# Patient Record
Sex: Female | Born: 2000 | Race: White | Hispanic: Yes | Marital: Single | Smoking: Never smoker
Health system: Southern US, Community
[De-identification: ages and names within clinical notes are randomized; demographics above are authoritative.]

---
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
## 2019-05-06 ENCOUNTER — Emergency Department (HOSPITAL_COMMUNITY): Payer: Medicaid Other

## 2019-05-06 ENCOUNTER — Encounter (HOSPITAL_COMMUNITY): Payer: Self-pay | Admitting: Emergency Medicine

## 2019-05-06 ENCOUNTER — Emergency Department (HOSPITAL_COMMUNITY)
Admission: EM | Admit: 2019-05-06 | Discharge: 2019-05-07 | Disposition: A | Discharge status: Home / Self Care | Payer: Medicaid Other | Attending: Emergency Medicine | Admitting: Emergency Medicine

## 2019-05-06 ENCOUNTER — Other Ambulatory Visit: Payer: Self-pay

## 2019-05-06 DIAGNOSIS — R0789 Other chest pain: Secondary | ICD-10-CM | POA: Insufficient documentation

## 2019-05-06 DIAGNOSIS — R070 Pain in throat: Secondary | ICD-10-CM | POA: Diagnosis not present

## 2019-05-06 DIAGNOSIS — R110 Nausea: Secondary | ICD-10-CM | POA: Insufficient documentation

## 2019-05-06 LAB — CBC
HCT: 38.8 % (ref 36.0–46.0)
Hemoglobin: 13.2 g/dL (ref 12.0–15.0)
MCH: 31 pg (ref 26.0–34.0)
MCHC: 34 g/dL (ref 30.0–36.0)
MCV: 91.1 fL (ref 80.0–100.0)
Platelets: 370 10*3/uL (ref 150–400)
RBC: 4.26 MIL/uL (ref 3.87–5.11)
RDW: 12 % (ref 11.5–15.5)
WBC: 7.3 10*3/uL (ref 4.0–10.5)
nRBC: 0 % (ref 0.0–0.2)

## 2019-05-06 LAB — PROTIME-INR
INR: 0.9 (ref 0.8–1.2)
Prothrombin Time: 12.5 seconds (ref 11.4–15.2)

## 2019-05-06 MED ORDER — SODIUM CHLORIDE 0.9% FLUSH: 3.0000 mL | Freq: Once | INTRAVENOUS | Status: DC

## 2019-05-06 NOTE — ED Triage Notes (Addendum)
Patient arrived with EMS from work reports central chest pressure/burning this evening with mild nausea and occasional dry cough , denies emesis or diaphoresis . No fever or chills . She received ASA 324 mg by EMS , CBG= 135.

## 2019-05-07 LAB — BASIC METABOLIC PANEL
Anion gap: 12 (ref 5–15)
BUN: 11 mg/dL (ref 6–20)
CO2: 21 mmol/L — ABNORMAL LOW (ref 22–32)
Calcium: 8.9 mg/dL (ref 8.9–10.3)
Chloride: 104 mmol/L (ref 98–111)
Creatinine, Ser: 0.84 mg/dL (ref 0.44–1.00)
GFR calc Af Amer: >60 mL/min (ref >60)
GFR calc non Af Amer: >60 mL/min (ref >60)
Glucose, Bld: 94 mg/dL (ref 70–99)
Potassium: 3.7 mmol/L (ref 3.5–5.1)
Sodium: 137 mmol/L (ref 135–145)

## 2019-05-07 LAB — TROPONIN I (HIGH SENSITIVITY): Troponin I (High Sensitivity): <2 ng/L (ref <18)

## 2019-05-07 MED ORDER — FAMOTIDINE 20 MG PO TABS: 20.0000 mg | ORAL_TABLET | Freq: Two times a day (BID) | ORAL | 0 refills | Status: DC

## 2019-05-07 MED ORDER — SUCRALFATE 1 G PO TABS: 1.0000 g | ORAL_TABLET | Freq: Three times a day (TID) | ORAL | 0 refills | Status: DC

## 2019-05-07 NOTE — ED Provider Notes (Signed)
[facility] EMERGENCY DEPARTMENT Provider Note   CSN: [id number] Arrival date & time: 05/06/19  2248     History Chief Complaint  Patient presents with  . Chest Pain    Sue Wiggins is a 19 y.o. female with a hx of major medical problems presents to the Emergency Department complaining of gradual, constant, now resolved nausea onset around 6 PM.  Patient reports that she had associated burning in her central chest and the back of her throat starting around 10 PM.  She reports this made her very nervous therefore she called 911.  She reports she was given aspirin by them.  Patient reports all of her symptoms have resolved completely.  She denies fever, chills, vomiting, abdominal pain, diarrhea, weakness, dizziness, syncope, dysuria.  She denies previous cardiac history.  She had no associated diaphoresis, syncope, near syncope or shortness of breath.  She has had no known Covid contacts and no additional Covid symptoms.  No loss of taste or smell.  Patient reports she did eat some spicy food prior to the onset of symptoms.   The history is provided by the patient and medical records. No language interpreter was used.       History reviewed. No pertinent past medical history.  There are no problems to display for this patient.   History reviewed. No pertinent surgical history.   OB History   No obstetric history on file.     No family history on file.  Social History   Tobacco Use  . Smoking status: Never Smoker  . Smokeless tobacco: Never Used  Substance Use Topics  . Alcohol use: Never  . Drug use: Never    Home Medications Prior to Admission medications   Medication Sig Start Date End Date Taking? Authorizing Provider  famotidine (PEPCID) 20 MG tablet Take 1 tablet (20 mg total) by mouth 2 (two) times daily. 05/07/19   Tinesha Siegrist, Dahlia Client, PA-C  sucralfate (CARAFATE) 1 g tablet Take 1 tablet (1 g total) by mouth 4 (four) times daily -  with  meals and at bedtime. 05/07/19   Labresha Mellor, Dahlia Client, PA-C    Allergies    Patient has no known allergies.  Review of Systems   Review of Systems  Constitutional: Negative for appetite change, diaphoresis, fatigue, fever and unexpected weight change.  HENT: Positive for sore throat. Negative for mouth sores.   Eyes: Negative for visual disturbance.  Respiratory: Negative for cough, chest tightness, shortness of breath and wheezing.   Cardiovascular: Positive for chest pain.  Gastrointestinal: Positive for nausea. Negative for abdominal pain, constipation, diarrhea and vomiting.  Endocrine: Negative for polydipsia, polyphagia and polyuria.  Genitourinary: Negative for dysuria, frequency, hematuria and urgency.  Musculoskeletal: Negative for back pain and neck stiffness.  Skin: Negative for rash.  Allergic/Immunologic: Negative for immunocompromised state.  Neurological: Negative for syncope, light-headedness and headaches.  Hematological: Does not bruise/bleed easily.  Psychiatric/Behavioral: Negative for sleep disturbance. The patient is not nervous/anxious.     Physical Exam Updated Vital Signs BP (!) 139/114 (BP Location: Left Arm)   Pulse 97   Temp 98.4 F (36.9 C) (Oral)   Resp 18   LMP 05/05/2019   SpO2 100%   Physical Exam Vitals and nursing note reviewed.  Constitutional:      General: She is not in acute distress.    Appearance: She is not diaphoretic.  HENT:     Head: Normocephalic.  Eyes:     General: No scleral icterus.  Conjunctiva/sclera: Conjunctivae normal.  Cardiovascular:     Rate and Rhythm: Normal rate and regular rhythm.     Pulses: Normal pulses.          Radial pulses are 2+ on the right side and 2+ on the left side.  Pulmonary:     Effort: No tachypnea, accessory muscle usage, prolonged expiration, respiratory distress or retractions.     Breath sounds: Normal breath sounds. No stridor.     Comments: Equal chest rise. No increased work of  breathing. Abdominal:     General: There is no distension.     Palpations: Abdomen is soft.     Tenderness: There is no abdominal tenderness. There is no guarding or rebound.  Musculoskeletal:     Cervical back: Normal range of motion.     Comments: Moves all extremities equally and without difficulty.  Skin:    General: Skin is warm and dry.     Capillary Refill: Capillary refill takes less than 2 seconds.  Neurological:     Mental Status: She is alert.     GCS: GCS eye subscore is 4. GCS verbal subscore is 5. GCS motor subscore is 6.     Comments: Speech is clear and goal oriented.  Psychiatric:        Mood and Affect: Mood normal.     ED Results / Procedures / Treatments   Labs (all labs ordered are listed, but only abnormal results are displayed) Labs Reviewed  BASIC METABOLIC PANEL - Abnormal; Notable for the following components:      Result Value   CO2 21 (*)    All other components within normal limits  CBC  PROTIME-INR  I-STAT BETA HCG BLOOD, ED ([facility], [facility], [facility] ONLY)  TROPONIN I (HIGH SENSITIVITY)  TROPONIN I (HIGH SENSITIVITY)    EKG EKG Interpretation  Date/Time:  Saturday May 06 2019 22:49:53 EST Ventricular Rate:  81 PR Interval:  136 QRS Duration: 82 QT Interval:  382 QTC Calculation: 443 R Axis:   68 Text Interpretation: Sinus rhythm with marked sinus arrhythmia Confirmed by Palumbo, April ([id number]) on 05/06/2019 11:17:43 PM   Radiology DG Chest 2 View  Result Date: 05/06/2019 CLINICAL DATA:  Chest pain. Dizziness and lightheaded. EXAM: CHEST - 2 VIEW COMPARISON:  None. FINDINGS: The cardiomediastinal contours are normal. The lungs are clear. Pulmonary vasculature is normal. No consolidation, pleural effusion, or pneumothorax. No acute osseous abnormalities are seen. IMPRESSION: Negative radiographs of the chest. Electronically Signed   By: Narda Rutherford M.D.   On: 05/06/2019 23:22    Procedures Procedures (including critical care  time)  Medications Ordered in ED Medications  sodium chloride flush (NS) 0.9 % injection 3 mL (has no administration in time range)    ED Course  I have reviewed the triage vital signs and the nursing notes.  Pertinent labs & imaging results that were available during my care of the patient were reviewed by me and considered in my medical decision making (see chart for details).  Clinical Course as of May 07 323  Sun May 07, 2019  0054 Negative.  Did not crossover  I-Stat beta hCG blood, ED [HM]  0056 No evidence of pulmonary edema, consolidation or groundglass opacities.  I personally evaluated these images.  DG Chest 2 View [HM]    Clinical Course User Index [HM] Ameenah Prosser, Boyd Kerbs   MDM Rules/Calculators/A&P  Patient presents with burning chest pain with associated nausea.  Chest pain is not likely of cardiac or pulmonary etiology d/t presentation, PERC negative, VSS, no tracheal deviation, no JVD or new murmur, RRR, breath sounds equal bilaterally, EKG without acute abnormalities, negative troponin, and negative CXR.  Suspect symptoms are secondary to acid reflux given the associated burning in the back of her throat.  She is well-appearing.  Offered Covid testing and patient has declined.  Pt has been advised to return to the ED if CP becomes exertional, associated with diaphoresis or nausea, radiates to left jaw/arm, worsens or becomes concerning in any way. Pt appears reliable for follow up and is agreeable to discharge.   BP (!) 132/98 (BP Location: Right Arm)   Pulse 86   Temp 98.4 F (36.9 C) (Oral)   Resp 16   LMP 05/05/2019   SpO2 100%    Final Clinical Impression(s) / ED Diagnoses Final diagnoses:  Burning chest pain    Rx / DC Orders ED Discharge Orders         Ordered    famotidine (PEPCID) 20 MG tablet  2 times daily     05/07/19 0051    sucralfate (CARAFATE) 1 g tablet  3 times daily with meals & bedtime     05/07/19 0051            Flavius Repsher, Jarrett Soho, PA-C 05/07/19 [place], April, MD 05/07/19 [phone]

## 2019-05-07 NOTE — Discharge Instructions (Addendum)
1. Medications: Carafate, Pepcid, usual home medications 2. Treatment: rest, drink plenty of fluids, advance diet slowly 3. Follow Up: Please followup with your primary doctor in 2 days for discussion of your diagnoses and further evaluation after today's visit; Please return to the ER for return of pain, persistent vomiting, high fevers or worsening symptoms

## 2019-05-07 NOTE — ED Notes (Signed)
Pt discharge and prescription education provided. Pt verbalizes understanding. Pt is alert and oriented x 4 and ambulatory at discharge.  

## 2020-05-13 ENCOUNTER — Other Ambulatory Visit: Payer: Medicaid Other

## 2021-04-08 ENCOUNTER — Other Ambulatory Visit: Payer: Self-pay

## 2021-04-08 ENCOUNTER — Encounter: Payer: Self-pay | Admitting: Nurse Practitioner

## 2021-04-08 ENCOUNTER — Other Ambulatory Visit (HOSPITAL_COMMUNITY)
Admission: RE | Admit: 2021-04-08 | Discharge: 2021-04-08 | Disposition: A | Discharge status: Home / Self Care | Payer: Medicaid Other | Source: Ambulatory Visit | Attending: Nurse Practitioner | Admitting: Nurse Practitioner

## 2021-04-08 ENCOUNTER — Ambulatory Visit (INDEPENDENT_AMBULATORY_CARE_PROVIDER_SITE_OTHER): Payer: Medicaid Other | Admitting: Nurse Practitioner

## 2021-04-08 VITALS — BP 117/73 | HR 75 | Ht 61.0 in | Wt 139.0 lb

## 2021-04-08 DIAGNOSIS — Z6826 Body mass index (BMI) 26.0-26.9, adult: Secondary | ICD-10-CM

## 2021-04-08 DIAGNOSIS — Z113 Encounter for screening for infections with a predominantly sexual mode of transmission: Secondary | ICD-10-CM | POA: Diagnosis not present

## 2021-04-08 DIAGNOSIS — Z3041 Encounter for surveillance of contraceptive pills: Secondary | ICD-10-CM | POA: Diagnosis not present

## 2021-04-08 DIAGNOSIS — Z01419 Encounter for gynecological examination (general) (routine) without abnormal findings: Secondary | ICD-10-CM | POA: Insufficient documentation

## 2021-04-08 DIAGNOSIS — A749 Chlamydial infection, unspecified: Secondary | ICD-10-CM

## 2021-04-08 DIAGNOSIS — Z7289 Other problems related to lifestyle: Secondary | ICD-10-CM | POA: Insufficient documentation

## 2021-04-08 MED ORDER — BLISOVI 24 FE 1-20 MG-MCG(24) PO TABS: 1.0000 | ORAL_TABLET | Freq: Every day | ORAL | 11 refills | Status: DC

## 2021-04-08 NOTE — Progress Notes (Signed)
GYNECOLOGY ANNUAL PREVENTATIVE CARE ENCOUNTER NOTE  Subjective:   Sue Wiggins is a 21 y.o. G0P0000 female here for a routine annual gynecologic exam.  Current complaints: new patient here and wants to have pills prescribed from this office.  [facility] but is not in school at present.   Denies abnormal vaginal bleeding, discharge, pelvic pain, problems with intercourse or other gynecologic concerns.    Gynecologic History Patient's last menstrual period was 03/28/2021 (exact date). Contraception: OCP (estrogen/progesterone) Last Pap: April 2022 outside of [place]. Results were: normal per patient.   Obstetric History OB History  Gravida Para Term Preterm AB Living  0 0 0 0 0 0  SAB IAB Ectopic Multiple Live Births  0 0 0 0 0    No past medical history on file.  No past surgical history on file.  Current Outpatient Medications on File Prior to Visit  Medication Sig Dispense Refill   famotidine (PEPCID) 20 MG tablet Take 1 tablet (20 mg total) by mouth 2 (two) times daily. 10 tablet 0   sucralfate (CARAFATE) 1 g tablet Take 1 tablet (1 g total) by mouth 4 (four) times daily -  with meals and at bedtime. 30 tablet 0   No current facility-administered medications on file prior to visit.    No Known Allergies  Social History   Socioeconomic History   Marital status: Single    Spouse name: Not on file   Number of children: Not on file   Years of education: Not on file   Highest education level: Not on file  Occupational History   Not on file  Tobacco Use   Smoking status: Never   Smokeless tobacco: Never  Vaping Use   Vaping Use: Every day  Substance and Sexual Activity   Alcohol use: Yes   Drug use: Yes    Types: Marijuana   Sexual activity: Yes    Birth control/protection: Pill  Other Topics Concern   Not on file  Social History Narrative   Not on file   Social Determinants of Health   Financial Resource Strain:  Not on file  Food Insecurity: Not on file  Transportation Needs: Not on file  Physical Activity: Not on file  Stress: Not on file  Social Connections: Not on file  Intimate Partner Violence: Not on file    No family history on file.  The following portions of the patient's history were reviewed and updated as appropriate: allergies, current medications, past family history, past medical history, past social history, past surgical history and problem list.  Review of Systems Pertinent items noted in HPI and remainder of comprehensive ROS otherwise negative.   Objective:  BP 117/73    Pulse 75    Ht 5\' 1"  (1.549 m)    Wt 139 lb (63 kg)    LMP 03/28/2021 (Exact Date)    BMI 26.26 kg/m  CONSTITUTIONAL: Well-developed, well-nourished female in no acute distress.  HENT:  Normocephalic, atraumatic, External right and left ear normal.  EYES: Conjunctivae and EOM are normal. Pupils are equal, round.  No scleral icterus.  NECK: Normal range of motion, supple, no masses.  Normal thyroid.  SKIN: Skin is warm and dry. No rash noted. Not diaphoretic. No erythema. No pallor.  Multiple tatoos. NEUROLOGIC: Alert and oriented to person, place, and time. Normal reflexes, muscle tone coordination. No cranial nerve deficit noted. PSYCHIATRIC: Normal mood and affect. Normal behavior. Normal judgment and thought content. CARDIOVASCULAR:  Normal heart rate noted, regular rhythm RESPIRATORY: Clear to auscultation bilaterally. Effort and breath sounds normal, no problems with respiration noted. BREASTS: Symmetric in size. Bilateral nipple piercings.  No masses, skin changes, nipple drainage, or lymphadenopathy. ABDOMEN: Soft, no distention noted.  No tenderness, rebound or guarding.  PELVIC: Normal appearing external genitalia; normal appearing vaginal mucosa and cervix.  No abnormal discharge noted.  Pap smear obtained.  Normal uterine size, no other palpable masses, no uterine or adnexal  tenderness. MUSCULOSKELETAL: Normal range of motion. No tenderness.  No cyanosis, clubbing, or edema.    Assessment and Plan:  1. Well woman exam with routine gynecological exam Bp normal.  Has been on pills for 2 months.  Will prescribe for one year as she is pleased with decreased cramping she has on this pill. Wants STD testing  - Cervicovaginal ancillary only - Hepatitis B surface antigen - Hepatitis C antibody - HIV Antibody (routine testing w rflx) - RPR  2. BMI 26.0-26.9,adult   3. Engages in vaping Strongly encouraged to stop vaping - started recently Discussed possible lung damage from unregulated vaping substances Encouraged using slow deep breathing for relaxation and stress management.  Routine preventative health maintenance measures emphasized. Please refer to After Visit Summary for other counseling recommendations.    Nolene Bernheim, RN, MSN, NP-BC Nurse Practitioner, [facility] for [facility]

## 2021-04-09 LAB — RPR: RPR Ser Ql: NONREACTIVE

## 2021-04-09 LAB — HEPATITIS B SURFACE ANTIGEN: Hepatitis B Surface Ag: NEGATIVE

## 2021-04-09 LAB — CERVICOVAGINAL ANCILLARY ONLY
Chlamydia: POSITIVE — AB
Comment: NEGATIVE
Comment: NEGATIVE
Comment: NORMAL
Neisseria Gonorrhea: NEGATIVE
Trichomonas: NEGATIVE

## 2021-04-09 LAB — HEPATITIS C ANTIBODY: Hep C Virus Ab: 0.2 s/co ratio (ref 0.0–0.9)

## 2021-04-09 LAB — HIV ANTIBODY (ROUTINE TESTING W REFLEX): HIV Screen 4th Generation wRfx: NONREACTIVE

## 2021-04-10 DIAGNOSIS — A749 Chlamydial infection, unspecified: Secondary | ICD-10-CM | POA: Insufficient documentation

## 2021-04-10 MED ORDER — DOXYCYCLINE HYCLATE 50 MG PO CAPS: 100.0000 mg | ORAL_CAPSULE | Freq: Two times a day (BID) | ORAL | 0 refills | Status: AC

## 2021-04-10 NOTE — Addendum Note (Signed)
Addended by: Currie Paris on: 04/10/2021 10:04 AM   Modules accepted: Orders

## 2021-04-24 IMAGING — CR DG CHEST 2V
2 series · 2 of 2 positions shown · non-contrast
Comparison: None.

CLINICAL DATA: Chest pain. Dizziness and lightheaded.

EXAM:
CHEST - 2 VIEW

[chest pa]
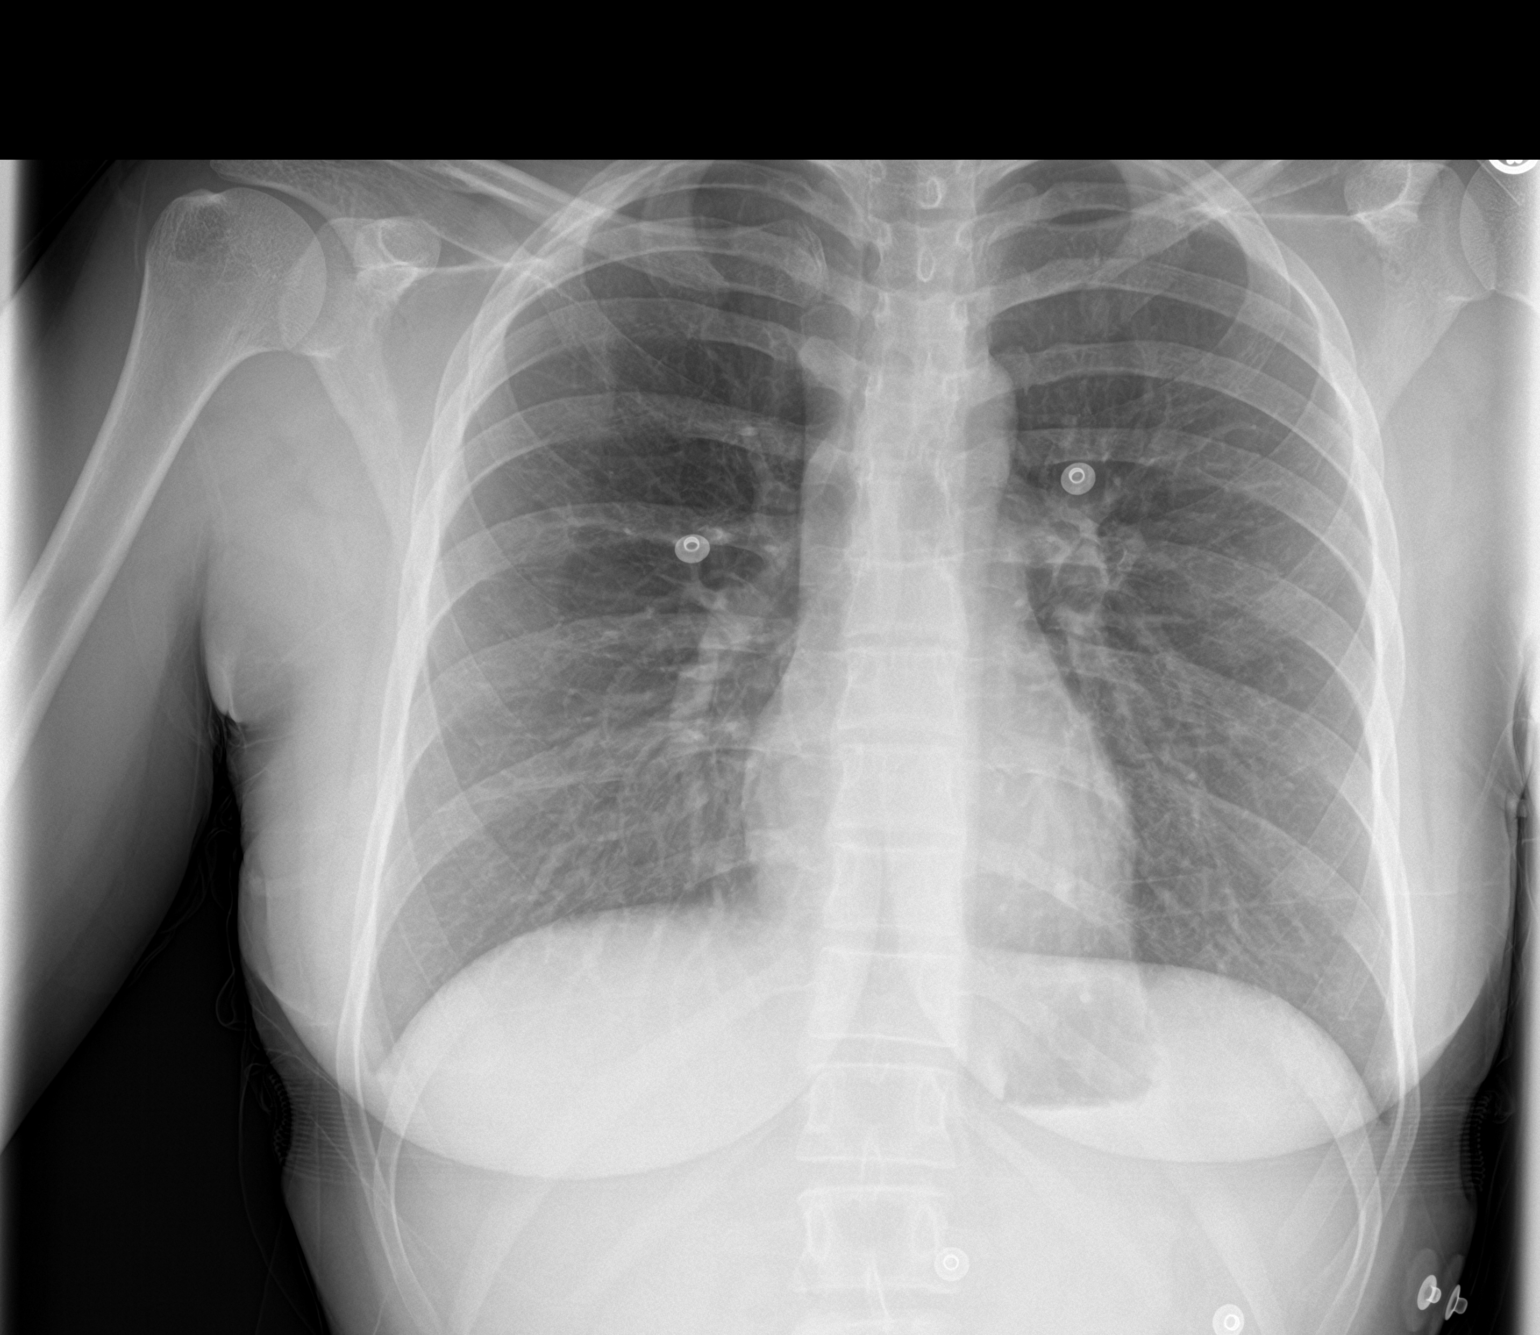

[chest lat]
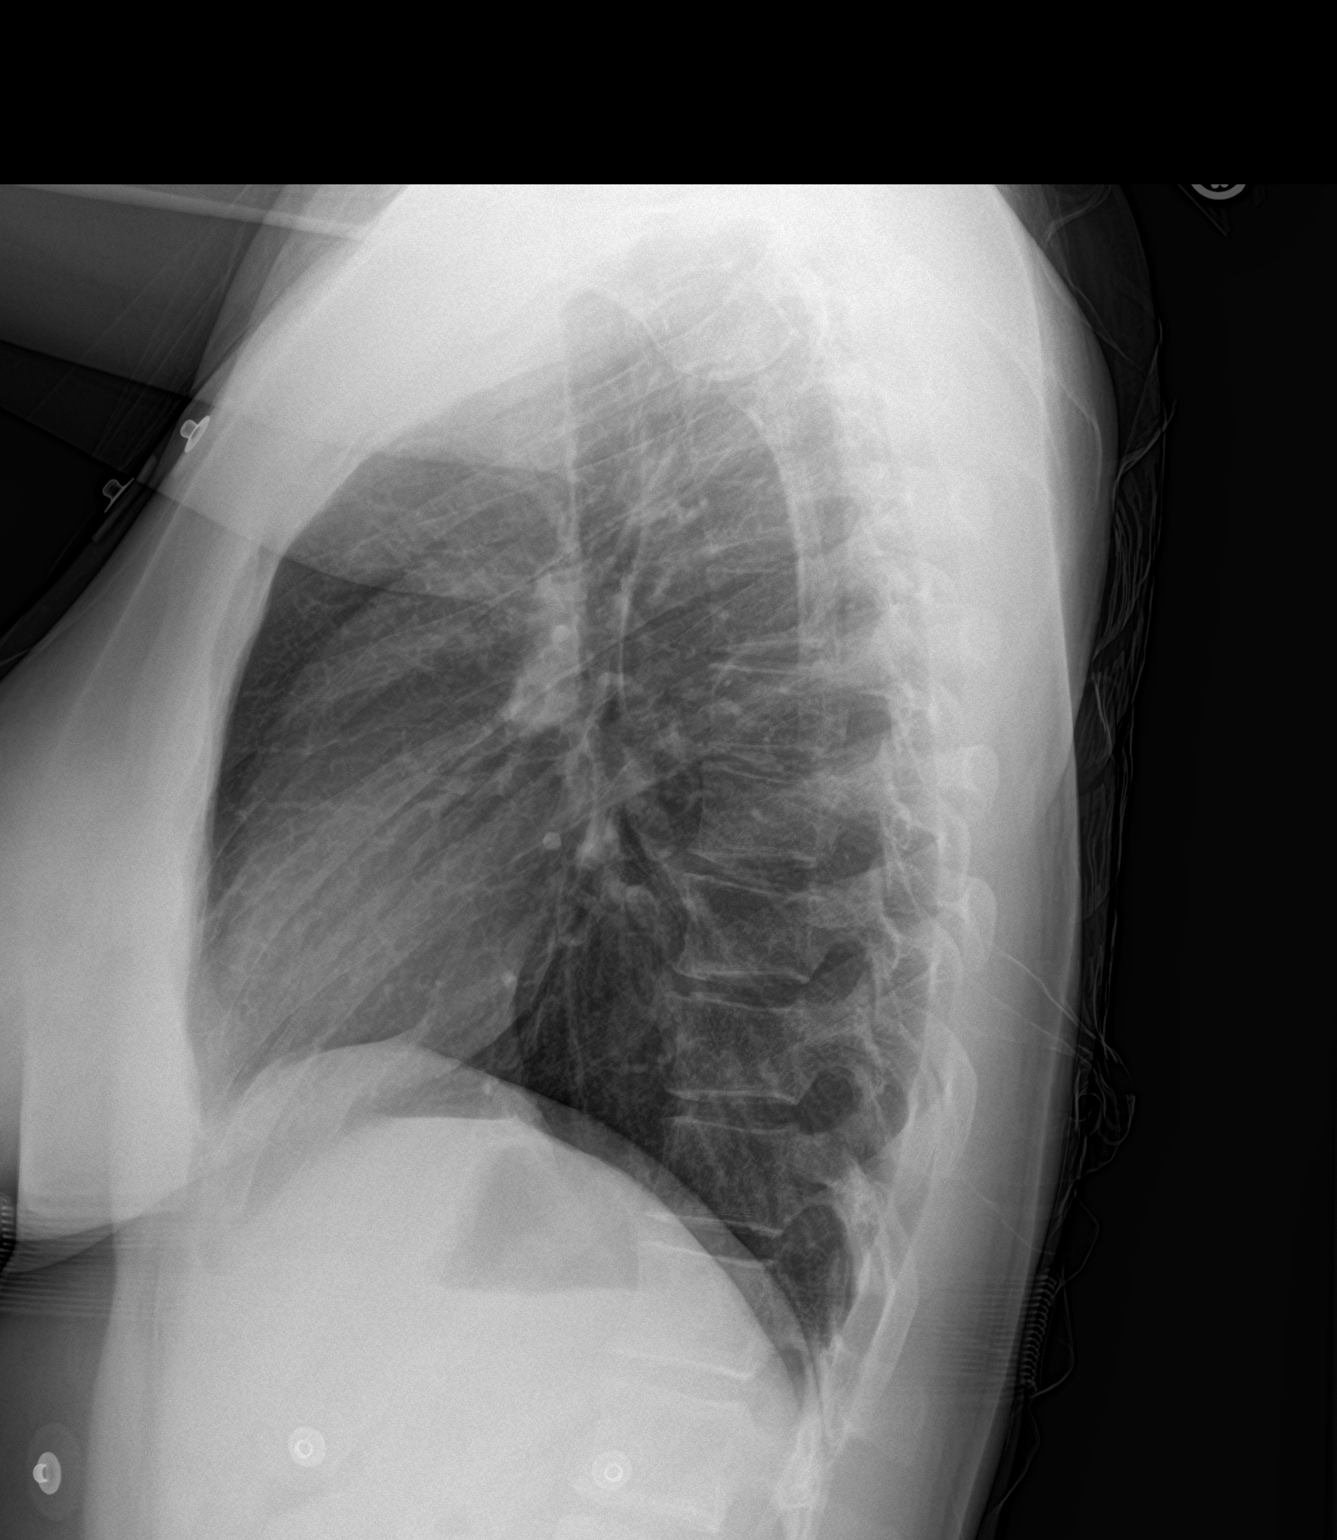

[2 of 2 positions shown; findings below may reference images not displayed]

FINDINGS: The cardiomediastinal contours are normal. The lungs are clear.
Pulmonary vasculature is normal. No consolidation, pleural effusion,
or pneumothorax. No acute osseous abnormalities are seen.
IMPRESSION: Negative radiographs of the chest.

## 2021-05-01 ENCOUNTER — Encounter (HOSPITAL_COMMUNITY): Payer: Self-pay

## 2021-05-01 ENCOUNTER — Ambulatory Visit (HOSPITAL_COMMUNITY)
Admission: EM | Admit: 2021-05-01 | Discharge: 2021-05-01 | Disposition: A | Discharge status: Home / Self Care | Payer: Medicaid Other | Attending: Internal Medicine | Admitting: Internal Medicine

## 2021-05-01 DIAGNOSIS — R059 Cough, unspecified: Secondary | ICD-10-CM | POA: Insufficient documentation

## 2021-05-01 DIAGNOSIS — R519 Headache, unspecified: Secondary | ICD-10-CM | POA: Diagnosis not present

## 2021-05-01 DIAGNOSIS — R0981 Nasal congestion: Secondary | ICD-10-CM | POA: Diagnosis not present

## 2021-05-01 DIAGNOSIS — R6889 Other general symptoms and signs: Secondary | ICD-10-CM

## 2021-05-01 DIAGNOSIS — R1031 Right lower quadrant pain: Secondary | ICD-10-CM | POA: Diagnosis not present

## 2021-05-01 DIAGNOSIS — R1032 Left lower quadrant pain: Secondary | ICD-10-CM | POA: Diagnosis not present

## 2021-05-01 DIAGNOSIS — Z20822 Contact with and (suspected) exposure to covid-19: Secondary | ICD-10-CM | POA: Diagnosis not present

## 2021-05-01 LAB — POC INFLUENZA A AND B ANTIGEN (URGENT CARE ONLY)
INFLUENZA A ANTIGEN, POC: NEGATIVE
INFLUENZA B ANTIGEN, POC: NEGATIVE

## 2021-05-01 MED ORDER — IBUPROFEN 600 MG PO TABS: 600.0000 mg | ORAL_TABLET | Freq: Four times a day (QID) | ORAL | 0 refills | Status: DC | PRN

## 2021-05-01 NOTE — ED Provider Notes (Signed)
[facility]    CSN: [id number] Arrival date & time: 05/01/21  1913      History   Chief Complaint Chief Complaint  Patient presents with   Cough   Nasal Congestion   Headache    RIGHT SIDE GROIN PAIN X 1-2 DAYS.    HPI Sue Wiggins is a 21 y.o. female comes to the urgent care with a 1 day history of sore throat, headache, generalized body aches and a cough.  Cough is nonproductive.  Patient denies any shortness of breath or wheezing.  No nausea, vomiting or diarrhea.  Patient complains of left groin pain which started yesterday as well.  She went to the gym and used the [employer].  This is a routine over the past couple of weeks.  She denies any trauma to the groin.  No swelling, bruising or ulcerations.  No rashes noted.  No dizziness, syncope or near syncopal episodes.   HPI  History reviewed. No pertinent past medical history.  Patient Active Problem List   Diagnosis Date Noted   Chlamydia infection 04/10/2021   Engages in vaping 04/08/2021    History reviewed. No pertinent surgical history.  OB History     Gravida  0   Para  0   Term  0   Preterm  0   AB  0   Living  0      SAB  0   IAB  0   Ectopic  0   Multiple  0   Live Births  0            Home Medications    Prior to Admission medications   Medication Sig Start Date End Date Taking? Authorizing Provider  ibuprofen (ADVIL) 600 MG tablet Take 1 tablet (600 mg total) by mouth every 6 (six) hours as needed. 05/01/21  Yes Lataysha Vohra, Britta Mccreedy, MD  Norethindrone Acetate-Ethinyl Estrad-FE (BLISOVI 24 FE) 1-20 MG-MCG(24) tablet Take 1 tablet by mouth daily. 04/08/21   Currie Paris, NP    Family History History reviewed. No pertinent family history.  Social History Social History   Tobacco Use   Smoking status: Never   Smokeless tobacco: Never   Tobacco comments:    Is vaping non tobacco products  Vaping Use   Vaping Use: Every day  Substance Use Topics   Alcohol  use: Yes   Drug use: Yes    Types: Marijuana     Allergies   Patient has no known allergies.   Review of Systems Review of Systems  Constitutional:  Positive for activity change. Negative for chills, fatigue and fever.  HENT:  Positive for sore throat. Negative for ear discharge and ear pain.   Respiratory:  Positive for cough. Negative for shortness of breath and wheezing.   Gastrointestinal: Negative.   Musculoskeletal:  Positive for myalgias.  Neurological: Negative.     Physical Exam Triage Vital Signs ED Triage Vitals  Enc Vitals Group     BP 05/01/21 1921 132/84     Pulse Rate 05/01/21 1921 73     Resp 05/01/21 1921 16     Temp 05/01/21 1921 99.3 F (37.4 C)     Temp Source 05/01/21 1921 Oral     SpO2 05/01/21 1921 99 %     Weight --      Height --      Head Circumference --      Peak Flow --      Pain Score 05/01/21 1922  7     Pain Loc --      Pain Edu? --      Excl. in GC? --    No data found.  Updated Vital Signs BP 132/84 (BP Location: Left Arm)    Pulse 73    Temp 99.3 F (37.4 C) (Oral)    Resp 16    LMP 03/07/2021 (Approximate)    SpO2 99%   Visual Acuity Right Eye Distance:   Left Eye Distance:   Bilateral Distance:    Right Eye Near:   Left Eye Near:    Bilateral Near:     Physical Exam Vitals and nursing note reviewed.  Constitutional:      General: She is not in acute distress.    Appearance: She is ill-appearing.  Cardiovascular:     Rate and Rhythm: Normal rate and regular rhythm.  Pulmonary:     Effort: Pulmonary effort is normal.     Breath sounds: Normal breath sounds.  Abdominal:     General: Bowel sounds are normal.     Palpations: Abdomen is soft.  Musculoskeletal:     Cervical back: No rigidity.     Comments: Tenderness on palpation in the left groin area.  No fluctuance, bruising or ulcerations or rash.  Lymphadenopathy:     Cervical: No cervical adenopathy.  Neurological:     Mental Status: She is alert.     GCS:  GCS eye subscore is 4. GCS verbal subscore is 5. GCS motor subscore is 6.     UC Treatments / Results  Labs (all labs ordered are listed, but only abnormal results are displayed) Labs Reviewed  SARS CORONAVIRUS 2 (TAT 6-24 HRS)  POC INFLUENZA A AND B ANTIGEN (URGENT CARE ONLY)    EKG   Radiology No results found.  Procedures Procedures (including critical care time)  Medications Ordered in UC Medications - No data to display  Initial Impression / Assessment and Plan / UC Course  I have reviewed the triage vital signs and the nursing notes.  Pertinent labs & imaging results that were available during my care of the patient were reviewed by me and considered in my medical decision making (see chart for details).     1.  Flulike symptoms: Flu A/B is negative COVID-19 PCR test has been sent Ibuprofen as needed for pain and/or fever Patient is encouraged to increase oral fluid intake We will call patient with recommendations if labs are abnormal Return precautions given. Final Clinical Impressions(s) / UC Diagnoses   Final diagnoses:  Flu-like symptoms     Discharge Instructions      Increase oral fluid intake Please take medications as prescribed We will call you with recommendations if labs are abnormal Return to urgent care if symptoms worsen.   ED Prescriptions     Medication Sig Dispense Auth. Provider   ibuprofen (ADVIL) 600 MG tablet Take 1 tablet (600 mg total) by mouth every 6 (six) hours as needed. 30 tablet Louay Myrie, Britta Mccreedy, MD      PDMP not reviewed this encounter.   Merrilee Jansky, MD 05/01/21 2026

## 2021-05-01 NOTE — ED Triage Notes (Signed)
Pt presents with headache,cough, and sore throat.  She report some groin pain x 1 day.

## 2021-05-01 NOTE — Discharge Instructions (Signed)
Increase oral fluid intake Please take medications as prescribed We will call you with recommendations if labs are abnormal Return to urgent care if symptoms worsen. 

## 2021-05-02 LAB — SARS CORONAVIRUS 2 (TAT 6-24 HRS): SARS Coronavirus 2: NEGATIVE

## 2021-05-13 ENCOUNTER — Other Ambulatory Visit: Payer: Self-pay

## 2021-05-13 ENCOUNTER — Encounter: Payer: Self-pay | Admitting: Obstetrics

## 2021-05-13 ENCOUNTER — Ambulatory Visit (INDEPENDENT_AMBULATORY_CARE_PROVIDER_SITE_OTHER): Payer: Medicaid Other | Admitting: Obstetrics

## 2021-05-13 ENCOUNTER — Other Ambulatory Visit (HOSPITAL_COMMUNITY)
Admission: RE | Admit: 2021-05-13 | Discharge: 2021-05-13 | Disposition: A | Discharge status: Home / Self Care | Payer: Medicaid Other | Source: Ambulatory Visit | Attending: Obstetrics | Admitting: Obstetrics

## 2021-05-13 VITALS — BP 108/72 | HR 60 | Ht 60.0 in | Wt 146.1 lb

## 2021-05-13 DIAGNOSIS — N926 Irregular menstruation, unspecified: Secondary | ICD-10-CM | POA: Diagnosis not present

## 2021-05-13 DIAGNOSIS — Z3041 Encounter for surveillance of contraceptive pills: Secondary | ICD-10-CM

## 2021-05-13 DIAGNOSIS — Z01419 Encounter for gynecological examination (general) (routine) without abnormal findings: Secondary | ICD-10-CM | POA: Diagnosis not present

## 2021-05-13 DIAGNOSIS — N898 Other specified noninflammatory disorders of vagina: Secondary | ICD-10-CM | POA: Diagnosis not present

## 2021-05-13 LAB — POCT URINE PREGNANCY: Preg Test, Ur: NEGATIVE

## 2021-05-13 NOTE — Progress Notes (Signed)
Subjective:        Sue Wiggins is a 21 y.o. female here for a routine exam.  Current complaints: Missed period after missing 2 days of OCP's.  Also c/o vaginal discharge..    Personal health questionnaire:  Is patient Ashkenazi Jewish, have a family history of breast and/or ovarian cancer: no Is there a family history of uterine cancer diagnosed at age < 76, gastrointestinal cancer, urinary tract cancer, family member who is a Personnel officer syndrome-associated carrier: no Is the patient overweight and hypertensive, family history of diabetes, personal history of gestational diabetes, preeclampsia or PCOS: no Is patient over 43, have PCOS,  family history of premature CHD under age 24, diabetes, smoke, have hypertension or peripheral artery disease:  no At any time, has a partner hit, kicked or otherwise hurt or frightened you?: no Over the past 2 weeks, have you felt down, depressed or hopeless?: no Over the past 2 weeks, have you felt little interest or pleasure in doing things?:no   Gynecologic History Patient's last menstrual period was 03/28/2021. Contraception: OCP (estrogen/progesterone) Last Pap: n/a. Results were: n/a Last mammogram: n/a. Results were: n/a  Obstetric History OB History  Gravida Para Term Preterm AB Living  0 0 0 0 0 0  SAB IAB Ectopic Multiple Live Births  0 0 0 0 0    History reviewed. No pertinent past medical history.  Past Surgical History:  Procedure Laterality Date   WISDOM TOOTH EXTRACTION  2016     Current Outpatient Medications:    ibuprofen (ADVIL) 600 MG tablet, Take 1 tablet (600 mg total) by mouth every 6 (six) hours as needed., Disp: 30 tablet, Rfl: 0   Norethindrone Acetate-Ethinyl Estrad-FE (BLISOVI 24 FE) 1-20 MG-MCG(24) tablet, Take 1 tablet by mouth daily., Disp: 28 tablet, Rfl: 11 No Known Allergies  Social History   Tobacco Use   Smoking status: Never   Smokeless tobacco: Never   Tobacco comments:    Is vaping non tobacco  products  Substance Use Topics   Alcohol use: Yes    Comment: occ    History reviewed. No pertinent family history.    Review of Systems  Constitutional: negative for fatigue and weight loss Respiratory: negative for cough and wheezing Cardiovascular: negative for chest pain, fatigue and palpitations Gastrointestinal: negative for abdominal pain and change in bowel habits Musculoskeletal:negative for myalgias Neurological: negative for gait problems and tremors Behavioral/Psych: negative for abusive relationship, depression Endocrine: negative for temperature intolerance    Genitourinary:positive for vaginal discharge and missed period.  negative for abnormal menstrual periods, genital lesions, hot flashes, sexual problems  Integument/breast: negative for breast lump, breast tenderness, nipple discharge and skin lesion(s)    Objective:       BP 108/72    Pulse 60    Ht 5' (1.524 m)    Wt 146 lb 1.6 oz (66.3 kg)    LMP 03/28/2021    BMI 28.53 kg/m  General:   Alert and no distress  Skin:   no rash or abnormalities  Lungs:   clear to auscultation bilaterally  Heart:   regular rate and rhythm, S1, S2 normal, no murmur, click, rub or gallop  Breasts:   normal without suspicious masses, skin or nipple changes or axillary nodes  Abdomen:  normal findings: no organomegaly, soft, non-tender and no hernia  Pelvis:  External genitalia: normal general appearance Urinary system: urethral meatus normal and bladder without fullness, nontender Vaginal: normal without tenderness, induration or masses Cervix: normal  appearance Adnexa: normal bimanual exam Uterus: anteverted and non-tender, normal size   Lab Review Urine pregnancy test Labs reviewed yes Radiologic studies reviewed no  I have spent a total of 20 minutes of face-to-face time, excluding clinical staff time, reviewing notes and preparing to see patient, ordering tests and/or medications, and counseling the patient.    Assessment:     1. Encounter for routine gynecological examination with Papanicolaou smear of cervix Rx: - Cytology - PAP( [place])  2. Vaginal discharge Rx: - Cervicovaginal ancillary only  3. Missed period.   - Negative subjective signs of pregnancy Rx: - POCT urine pregnancy: NEGATIVE  4. Encounter for surveillance of contraceptive pills - contraceptive options discussed - plans to continue OCP's    Plan:    Education reviewed: calcium supplements, depression evaluation, low fat, low cholesterol diet, safe sex/STD prevention, self breast exams, and weight bearing exercise. Contraception: OCP (estrogen/progesterone). Follow up in: 1 year.    Orders Placed This Encounter  Procedures   POCT urine pregnancy   POCT urinalysis dipstick     Brock Bad, MD 05/13/2021 11:41 AM

## 2021-05-13 NOTE — Progress Notes (Signed)
Patient presents for missing a period on ocp. Patient states that she has taken 4 HPT which were all negative. She states that she missed taking two pills on the 1/18 and 1/19. She doubled up on the 20  Patient also request to have a TOC for positive chlamydia. Does not have pap on file.

## 2021-05-15 ENCOUNTER — Encounter: Payer: Self-pay | Admitting: *Deleted

## 2021-05-15 ENCOUNTER — Other Ambulatory Visit: Payer: Self-pay | Admitting: Obstetrics

## 2021-05-15 DIAGNOSIS — B9689 Other specified bacterial agents as the cause of diseases classified elsewhere: Secondary | ICD-10-CM

## 2021-05-15 LAB — CERVICOVAGINAL ANCILLARY ONLY
Bacterial Vaginitis (gardnerella): POSITIVE — AB
Candida Glabrata: NEGATIVE
Candida Vaginitis: NEGATIVE
Chlamydia: NEGATIVE
Comment: NEGATIVE
Comment: NEGATIVE
Comment: NEGATIVE
Comment: NEGATIVE
Comment: NEGATIVE
Comment: NORMAL
Neisseria Gonorrhea: NEGATIVE
Trichomonas: NEGATIVE

## 2021-05-15 MED ORDER — METRONIDAZOLE 500 MG PO TABS: 500.0000 mg | ORAL_TABLET | Freq: Two times a day (BID) | ORAL | 2 refills | Status: DC

## 2021-05-15 NOTE — Progress Notes (Signed)
MyChart message sent. Notified of BV and RX Flagyl. Education included.

## 2021-05-16 LAB — CYTOLOGY - PAP
Adequacy: ABSENT
Diagnosis: NEGATIVE

## 2021-06-13 ENCOUNTER — Encounter (HOSPITAL_COMMUNITY): Payer: Self-pay

## 2021-06-13 ENCOUNTER — Other Ambulatory Visit: Payer: Self-pay

## 2021-06-13 ENCOUNTER — Ambulatory Visit (HOSPITAL_COMMUNITY)
Admission: EM | Admit: 2021-06-13 | Discharge: 2021-06-13 | Disposition: A | Discharge status: Home / Self Care | Payer: Medicaid Other | Attending: Urgent Care | Admitting: Urgent Care

## 2021-06-13 DIAGNOSIS — Z20822 Contact with and (suspected) exposure to covid-19: Secondary | ICD-10-CM | POA: Insufficient documentation

## 2021-06-13 LAB — SARS CORONAVIRUS 2 (TAT 6-24 HRS): SARS Coronavirus 2: NEGATIVE

## 2021-06-13 NOTE — ED Provider Notes (Signed)
?[facility] ? ? ? ?CSN: [id number] ?Arrival date & time: 06/13/21  1242 ? ? ?  ? ?History   ?Chief Complaint ?Chief Complaint  ?Patient presents with  ? Covid Exposure  ? ? ?HPI ?Sue Wiggins is a 21 y.o. female.  ? ?Pleasant 21 year old female with no known past medical history presents today requesting a COVID test.  She states she was with her roommate yesterday, and brought her into our urgent care due to URI symptoms.  Her roommate got a call this morning that she tested positive for COVID.  Patient is concerned that maybe she has contracted it.  Patient has never had COVID in the past, and has not been vaccinated.  She takes no prescription medication other than birth control.  No history of asthma.  Patient denies any current symptoms, no fever, cough, congestion, sinus pain, headache.  She feels at her baseline health. ? ? ? ?History reviewed. No pertinent past medical history. ? ?Patient Active Problem List  ? Diagnosis Date Noted  ? Chlamydia infection 04/10/2021  ? Engages in [place] 04/08/2021  ? ? ?Past Surgical History:  ?Procedure Laterality Date  ? [place] EXTRACTION  2016  ? ? ?OB History   ? ? Gravida  ?0  ? Para  ?0  ? Term  ?0  ? Preterm  ?0  ? AB  ?0  ? Living  ?0  ?  ? ? SAB  ?0  ? IAB  ?0  ? Ectopic  ?0  ? Multiple  ?0  ? Live Births  ?0  ?   ?  ?  ? ? ? ?Home Medications   ? ?Prior to Admission medications   ?Medication Sig Start Date End Date Taking? Authorizing Provider  ?ibuprofen (ADVIL) 600 MG tablet Take 1 tablet (600 mg total) by mouth every 6 (six) hours as needed. 05/01/21   LampteyMyrene Galas, MD  ?Norethindrone Acetate-Ethinyl Estrad-FE (BLISOVI 24 FE) 1-20 MG-MCG(24) tablet Take 1 tablet by mouth daily. 04/08/21   Virginia Rochester, NP  ? ? ?Family History ?History reviewed. No pertinent family history. ? ?Social History ?Social History  ? ?Tobacco Use  ? Smoking status: Never  ? Smokeless tobacco: Never  ? Tobacco comments:  ?  Is vaping non tobacco products  ?Vaping  Use  ? Vaping Use: Some days  ?Substance Use Topics  ? Alcohol use: Yes  ?  Comment: occ  ? Drug use: Yes  ?  Types: Marijuana  ?  Comment: occ  ? ? ? ?Allergies   ?Patient has no known allergies. ? ? ?Review of Systems ?Review of Systems  ?All other systems reviewed and are negative. ? ? ?Physical Exam ?Triage Vital Signs ?ED Triage Vitals  ?Enc Vitals Group  ?   BP 06/13/21 1332 129/76  ?   Pulse Rate 06/13/21 1332 76  ?   Resp 06/13/21 1332 18  ?   Temp 06/13/21 1332 98.1 ?F (36.7 ?C)  ?   Temp Source 06/13/21 1332 Oral  ?   SpO2 06/13/21 1332 98 %  ?   Weight --   ?   Height --   ?   Head Circumference --   ?   Peak Flow --   ?   Pain Score 06/13/21 1331 0  ?   Pain Loc --   ?   Pain Edu? --   ?   Excl. in [place]? --   ? ?No data found. ? ?Updated Vital  Signs ?BP 129/76 (BP Location: Right Arm)   Pulse 76   Temp 98.1 ?F (36.7 ?C) (Oral)   Resp 18   SpO2 98%  ? ?Visual Acuity ?Right Eye Distance:   ?Left Eye Distance:   ?Bilateral Distance:   ? ?Right Eye Near:   ?Left Eye Near:    ?Bilateral Near:    ? ?Physical Exam ?Vitals and nursing note reviewed.  ?Constitutional:   ?   General: She is not in acute distress. ?   Appearance: Normal appearance. She is well-developed and normal weight. She is not ill-appearing, toxic-appearing or diaphoretic.  ?HENT:  ?   Head: Normocephalic and atraumatic.  ?   Right Ear: Tympanic membrane, ear canal and external ear normal.  ?   Left Ear: Tympanic membrane, ear canal and external ear normal.  ?   Nose: Nose normal. No congestion or rhinorrhea.  ?   Mouth/Throat:  ?   Mouth: Mucous membranes are moist.  ?   Pharynx: Oropharynx is clear. No oropharyngeal exudate or posterior oropharyngeal erythema.  ?Eyes:  ?   General: No scleral icterus.    ?   Right eye: No discharge.     ?   Left eye: No discharge.  ?   Extraocular Movements: Extraocular movements intact.  ?   Conjunctiva/sclera: Conjunctivae normal.  ?   Pupils: Pupils are equal, round, and reactive to light.   ?Cardiovascular:  ?   Rate and Rhythm: Normal rate and regular rhythm.  ?   Heart sounds: No murmur heard. ?Pulmonary:  ?   Effort: Pulmonary effort is normal. No respiratory distress.  ?   Breath sounds: Normal breath sounds. No stridor. No wheezing or rhonchi.  ?Abdominal:  ?   Palpations: Abdomen is soft.  ?   Tenderness: There is no abdominal tenderness.  ?Musculoskeletal:     ?   General: No swelling.  ?   Cervical back: Normal range of motion and neck supple.  ?Skin: ?   General: Skin is warm and dry.  ?   Capillary Refill: Capillary refill takes less than 2 seconds.  ?   Coloration: Skin is not jaundiced.  ?   Findings: No erythema or rash.  ?Neurological:  ?   General: No focal deficit present.  ?   Mental Status: She is alert and oriented to person, place, and time.  ?Psychiatric:     ?   Mood and Affect: Mood normal.  ? ? ? ?UC Treatments / Results  ?Labs ?(all labs ordered are listed, but only abnormal results are displayed) ?Labs Reviewed  ?SARS CORONAVIRUS 2 (TAT 6-24 HRS)  ? ? ?EKG ? ? ?Radiology ?No results found. ? ?Procedures ?Procedures (including critical care time) ? ?Medications Ordered in UC ?Medications - No data to display ? ?Initial Impression / Assessment and Plan / UC Course  ?I have reviewed the triage vital signs and the nursing notes. ? ?Pertinent labs & imaging results that were available during my care of the patient were reviewed by me and considered in my medical decision making (see chart for details). ? ?  ? ?Exposure to covid -patient is asymptomatic with no risk factors.  Even if positive, patient is not an ideal candidate for antiviral therapy.  Due to lack of symptoms, no further intervention needed.  We will call with results of test once received. ? ?Final Clinical Impressions(s) / UC Diagnoses  ? ?Final diagnoses:  ?Exposure to COVID-19 virus  ? ? ? ?Discharge Instructions   ? ?  ?  You were tested today for covid. We will call you with the results once they have been  received. ?You are not a candidate for antiviral therapy even if positive. ?Prevention is key: wear a mask, distance >18ft from someone who is ill or contagious, wash hands frequently. ? ? ? ?ED Prescriptions   ?None ?  ? ?PDMP not reviewed this encounter. ?  Chaney Malling, [place] ?06/13/21 1405 ? ?

## 2021-06-13 NOTE — ED Triage Notes (Signed)
Pt presents today for covid testing reporting that her roommate tested positive for covid yesterday. Onset this morning of throat pain that has resolved as the morning progressed. No meds taken. Denies cough, congestion, runny nose and v/d. ?

## 2021-06-13 NOTE — Discharge Instructions (Addendum)
You were tested today for covid. We will call you with the results once they have been received. ?You are not a candidate for antiviral therapy even if positive. ?Prevention is key: wear a mask, distance >55ft from someone who is ill or contagious, wash hands frequently. ?

## 2021-07-07 ENCOUNTER — Other Ambulatory Visit (HOSPITAL_COMMUNITY)
Admission: RE | Admit: 2021-07-07 | Discharge: 2021-07-07 | Disposition: A | Discharge status: Home / Self Care | Payer: Medicaid Other | Source: Ambulatory Visit | Attending: Obstetrics and Gynecology | Admitting: Obstetrics and Gynecology

## 2021-07-07 ENCOUNTER — Ambulatory Visit (INDEPENDENT_AMBULATORY_CARE_PROVIDER_SITE_OTHER): Payer: Medicaid Other | Admitting: *Deleted

## 2021-07-07 ENCOUNTER — Other Ambulatory Visit: Payer: Self-pay | Admitting: *Deleted

## 2021-07-07 VITALS — BP 108/70 | HR 68

## 2021-07-07 DIAGNOSIS — Z202 Contact with and (suspected) exposure to infections with a predominantly sexual mode of transmission: Secondary | ICD-10-CM | POA: Diagnosis present

## 2021-07-07 NOTE — Progress Notes (Addendum)
SUBJECTIVE:  ?21 y.o. female who desires a STI screen. TOC CT ?Denies abnormal vaginal discharge, bleeding or significant pelvic pain. No UTI symptoms. Denies history of known exposure to STD. ? ?LMP 06/20/21 ? ?OBJECTIVE:  ?She appears well. ? ? ?ASSESSMENT:  ?STI Screen  ? ?PLAN:  ?Pt offered STI blood screening-not indicated ?GC, chlamydia, and trichomonas probe sent to lab.  ?Treatment: To be determined once lab results are received. ? ?Pt follow up as needed.  ?

## 2021-07-08 LAB — CERVICOVAGINAL ANCILLARY ONLY
Bacterial Vaginitis (gardnerella): POSITIVE — AB
Candida Glabrata: NEGATIVE
Candida Vaginitis: NEGATIVE
Chlamydia: POSITIVE — AB
Comment: NEGATIVE
Comment: NEGATIVE
Comment: NEGATIVE
Comment: NEGATIVE
Comment: NEGATIVE
Comment: NORMAL
Neisseria Gonorrhea: NEGATIVE
Trichomonas: NEGATIVE

## 2021-07-10 ENCOUNTER — Telehealth: Payer: Self-pay | Admitting: Emergency Medicine

## 2021-07-10 DIAGNOSIS — A749 Chlamydial infection, unspecified: Secondary | ICD-10-CM

## 2021-07-10 DIAGNOSIS — B9689 Other specified bacterial agents as the cause of diseases classified elsewhere: Secondary | ICD-10-CM

## 2021-07-10 MED ORDER — DOXYCYCLINE HYCLATE 100 MG PO CAPS: 100.0000 mg | ORAL_CAPSULE | Freq: Two times a day (BID) | ORAL | 0 refills | Status: DC

## 2021-07-10 MED ORDER — METRONIDAZOLE 500 MG PO TABS: 500.0000 mg | ORAL_TABLET | Freq: Two times a day (BID) | ORAL | 0 refills | Status: DC

## 2021-07-10 NOTE — Telephone Encounter (Signed)
TC to patient to inform of results and Rx sent to pharmacy.  ?

## 2021-08-05 ENCOUNTER — Ambulatory Visit (INDEPENDENT_AMBULATORY_CARE_PROVIDER_SITE_OTHER): Payer: Medicaid Other

## 2021-08-05 ENCOUNTER — Other Ambulatory Visit (HOSPITAL_COMMUNITY)
Admission: RE | Admit: 2021-08-05 | Discharge: 2021-08-05 | Disposition: A | Discharge status: Home / Self Care | Payer: Medicaid Other | Source: Ambulatory Visit | Attending: Obstetrics and Gynecology | Admitting: Obstetrics and Gynecology

## 2021-08-05 DIAGNOSIS — Z113 Encounter for screening for infections with a predominantly sexual mode of transmission: Secondary | ICD-10-CM | POA: Diagnosis not present

## 2021-08-05 DIAGNOSIS — Z8619 Personal history of other infectious and parasitic diseases: Secondary | ICD-10-CM

## 2021-08-05 NOTE — Progress Notes (Cosign Needed)
Patient presents for a TOC for positive CH on 4/3. Patient states that she and her partner completed treatment and abstained for intercourse for a week after completing the treatment. Patient denies having any vaginal discharge, odor, or irritation at this time. Self swab completed. Patient would lie to be retested for everything due to having a new partner.  Patient informed that it may take 24-48 hours for results to return. Patient informed that she will be contacted if there is anything abnormal. ?

## 2021-08-05 NOTE — Progress Notes (Signed)
Patient was assessed and managed by nursing staff during this encounter. I have reviewed the chart and agree with the documentation and plan. I have also made any necessary editorial changes. ? ?Warden Fillers, MD ?08/05/2021 12:47 PM   ?

## 2021-08-06 LAB — CERVICOVAGINAL ANCILLARY ONLY
Bacterial Vaginitis (gardnerella): NEGATIVE
Candida Glabrata: NEGATIVE
Candida Vaginitis: NEGATIVE
Chlamydia: NEGATIVE
Comment: NEGATIVE
Comment: NEGATIVE
Comment: NEGATIVE
Comment: NEGATIVE
Comment: NEGATIVE
Comment: NORMAL
Neisseria Gonorrhea: NEGATIVE
Trichomonas: NEGATIVE

## 2021-09-01 ENCOUNTER — Ambulatory Visit (HOSPITAL_COMMUNITY)
Admission: EM | Admit: 2021-09-01 | Discharge: 2021-09-01 | Disposition: A | Discharge status: Home / Self Care | Payer: Medicaid Other | Attending: Family Medicine | Admitting: Family Medicine

## 2021-09-01 ENCOUNTER — Encounter (HOSPITAL_COMMUNITY): Payer: Self-pay | Admitting: *Deleted

## 2021-09-01 DIAGNOSIS — Z202 Contact with and (suspected) exposure to infections with a predominantly sexual mode of transmission: Secondary | ICD-10-CM | POA: Insufficient documentation

## 2021-09-01 LAB — POC URINE PREG, ED: Preg Test, Ur: NEGATIVE

## 2021-09-01 LAB — HIV ANTIBODY (ROUTINE TESTING W REFLEX): HIV Screen 4th Generation wRfx: NONREACTIVE

## 2021-09-01 NOTE — ED Triage Notes (Signed)
Denies any sxs. States was informed by a person that her sexual partner has had other partners. Pt states she wishes to be checked for STDs.

## 2021-09-01 NOTE — ED Provider Notes (Signed)
[facility]    CSN: [id number] Arrival date & time: 09/01/21  1714      History   Chief Complaint Chief Complaint  Patient presents with   STD Check    HPI Sue Wiggins is a 21 y.o. female.   HPI Patient is here for STD check.  Patient recently learned that her partner may have been  sexually active with other partners.  She is asymptomatic. Patient's last menstrual period was 08/16/2021 (approximate).   History reviewed. No pertinent past medical history.  Patient Active Problem List   Diagnosis Date Noted   Chlamydia infection 04/10/2021   Engages in vaping 04/08/2021    Past Surgical History:  Procedure Laterality Date   WISDOM TOOTH EXTRACTION  2016    OB History     Gravida  0   Para  0   Term  0   Preterm  0   AB  0   Living  0      SAB  0   IAB  0   Ectopic  0   Multiple  0   Live Births  0            Home Medications    Prior to Admission medications   Medication Sig Start Date End Date Taking? Authorizing Provider  Norethindrone Acetate-Ethinyl Estrad-FE (BLISOVI 24 FE) 1-20 MG-MCG(24) tablet Take 1 tablet by mouth daily. 04/08/21  Yes Burleson, Terri L, NP  doxycycline (VIBRAMYCIN) 100 MG capsule Take 1 capsule (100 mg total) by mouth 2 (two) times daily. 07/10/21   Constant, Peggy, MD  ibuprofen (ADVIL) 600 MG tablet Take 1 tablet (600 mg total) by mouth every 6 (six) hours as needed. 05/01/21   Lamptey, Britta Mccreedy, MD  metroNIDAZOLE (FLAGYL) 500 MG tablet Take 1 tablet (500 mg total) by mouth 2 (two) times daily. 07/10/21   Constant, Peggy, MD    Family History Family History  Problem Relation Age of Onset   Healthy Mother     Social History Social History   Tobacco Use   Smoking status: Never   Smokeless tobacco: Never   Tobacco comments:    Is vaping non tobacco products  Vaping Use   Vaping Use: Every day  Substance Use Topics   Alcohol use: Yes    Comment: occasionally   Drug use: Yes    Types:  Marijuana    Comment: occasionally     Allergies   Patient has no known allergies.   Review of Systems Review of Systems Pertinent negatives listed in HPI   Physical Exam Triage Vital Signs ED Triage Vitals  Enc Vitals Group     BP 09/01/21 1722 121/86     Pulse Rate 09/01/21 1722 68     Resp 09/01/21 1722 16     Temp 09/01/21 1722 98.2 F (36.8 C)     Temp src --      SpO2 09/01/21 1722 100 %     Weight --      Height --      Head Circumference --      Peak Flow --      Pain Score 09/01/21 1724 0     Pain Loc --      Pain Edu? --      Excl. in [place]? --    No data found.  Updated Vital Signs BP 121/86   Pulse 68   Temp 98.2 F (36.8 C)   Resp 16   LMP 08/16/2021 (  Approximate)   SpO2 100%   Visual Acuity Right Eye Distance:   Left Eye Distance:   Bilateral Distance:    Right Eye Near:   Left Eye Near:    Bilateral Near:     Physical Exam General appearance: alert, well developed, cooperative  Head: Normocephalic, without obvious abnormality, atraumatic Respiratory: Respirations even and unlabored, normal respiratory rate Heart: rate and rhythm normal.  Skin: Skin color, texture, turgor normal. No rashes seen  Psych: Appropriate mood and affect. Vaginal-cytology self collected.  UC Treatments / Results  Labs (all labs ordered are listed, but only abnormal results are displayed) Labs Reviewed  HIV ANTIBODY (ROUTINE TESTING W REFLEX)  RPR  POC URINE PREG, ED  CERVICOVAGINAL ANCILLARY ONLY    EKG   Radiology No results found.  Procedures Procedures (including critical care time)  Medications Ordered in UC Medications - No data to display  Initial Impression / Assessment and Plan / UC Course  I have reviewed the triage vital signs and the nursing notes.  Pertinent labs & imaging results that were available during my care of the patient were reviewed by me and considered in my medical decision making (see chart for details).    STD  check, asymptomatic Cytology, HIV, RPR pending. Condoms provided. Return as needed. Final Clinical Impressions(s) / UC Diagnoses   Final diagnoses:  Potential exposure to STD     Discharge Instructions      STD test typically result within 24 hours. Your results will be visible via MyChart. If any results are abnormal, our staff will contact you via phone/MyChart to discuss results and treatment.     ED Prescriptions   None    PDMP not reviewed this encounter.   Bing Neighbors, FNP 09/01/21 1746

## 2021-09-01 NOTE — Discharge Instructions (Signed)
STD test typically result within 24 hours. Your results will be visible via MyChart. If any results are abnormal, our staff will contact you via phone/MyChart to discuss results and treatment.

## 2021-09-02 LAB — CERVICOVAGINAL ANCILLARY ONLY
Bacterial Vaginitis (gardnerella): POSITIVE — AB
Candida Glabrata: NEGATIVE
Candida Vaginitis: POSITIVE — AB
Chlamydia: NEGATIVE
Comment: NEGATIVE
Comment: NEGATIVE
Comment: NEGATIVE
Comment: NEGATIVE
Comment: NEGATIVE
Comment: NORMAL
Neisseria Gonorrhea: NEGATIVE
Trichomonas: NEGATIVE

## 2021-09-02 LAB — RPR: RPR Ser Ql: NONREACTIVE

## 2021-09-03 ENCOUNTER — Telehealth (HOSPITAL_COMMUNITY): Payer: Self-pay | Admitting: Emergency Medicine

## 2021-09-03 MED ORDER — FLUCONAZOLE 150 MG PO TABS: 150.0000 mg | ORAL_TABLET | Freq: Once | ORAL | 0 refills | Status: AC

## 2021-09-03 MED ORDER — METRONIDAZOLE 500 MG PO TABS: 500.0000 mg | ORAL_TABLET | Freq: Two times a day (BID) | ORAL | 0 refills | Status: DC

## 2021-12-11 ENCOUNTER — Ambulatory Visit (INDEPENDENT_AMBULATORY_CARE_PROVIDER_SITE_OTHER): Payer: Medicaid Other | Admitting: General Practice

## 2021-12-11 ENCOUNTER — Other Ambulatory Visit (HOSPITAL_COMMUNITY)
Admission: RE | Admit: 2021-12-11 | Discharge: 2021-12-11 | Disposition: A | Discharge status: Home / Self Care | Payer: Medicaid Other | Source: Ambulatory Visit | Attending: Obstetrics and Gynecology | Admitting: Obstetrics and Gynecology

## 2021-12-11 VITALS — BP 133/89 | HR 104 | Ht 60.0 in | Wt 134.9 lb

## 2021-12-11 DIAGNOSIS — Z202 Contact with and (suspected) exposure to infections with a predominantly sexual mode of transmission: Secondary | ICD-10-CM

## 2021-12-11 NOTE — Progress Notes (Signed)
SUBJECTIVE:  21 y.o. female presnts for STD testing. Pt states she has new sexual partner and just want testing to be safe. Denies abnormal vaginal bleeding or significant pelvic pain or fever. No UTI symptoms. Denies history of known exposure to STD.  No LMP recorded.  OBJECTIVE:  She appears well, afebrile. Urine dipstick: not performed  ASSESSMENT:  Vaginal Discharge  Vaginal Odor   PLAN:  GC, chlamydia, trichomonas, BVAG, CVAG probe sent to lab. Treatment: To be determined once lab results are received ROV prn if symptoms persist or worsen.

## 2021-12-12 LAB — CERVICOVAGINAL ANCILLARY ONLY
Bacterial Vaginitis (gardnerella): POSITIVE — AB
Candida Glabrata: NEGATIVE
Candida Vaginitis: NEGATIVE
Chlamydia: POSITIVE — AB
Comment: NEGATIVE
Comment: NEGATIVE
Comment: NEGATIVE
Comment: NEGATIVE
Comment: NEGATIVE
Comment: NORMAL
Neisseria Gonorrhea: NEGATIVE
Trichomonas: NEGATIVE

## 2021-12-13 MED ORDER — DOXYCYCLINE HYCLATE 100 MG PO CAPS: 100.0000 mg | ORAL_CAPSULE | Freq: Two times a day (BID) | ORAL | 0 refills | Status: DC

## 2021-12-13 MED ORDER — METRONIDAZOLE 500 MG PO TABS: 500.0000 mg | ORAL_TABLET | Freq: Two times a day (BID) | ORAL | 0 refills | Status: DC

## 2021-12-13 NOTE — Addendum Note (Signed)
Addended by: Catalina Antigua on: 12/13/2021 09:27 AM   Modules accepted: Orders

## 2021-12-15 ENCOUNTER — Telehealth: Payer: Self-pay | Admitting: Emergency Medicine

## 2021-12-15 NOTE — Telephone Encounter (Signed)
TC to patient to discuss results, Rx 

## 2022-01-09 ENCOUNTER — Other Ambulatory Visit: Payer: Self-pay | Admitting: Obstetrics and Gynecology

## 2022-01-11 ENCOUNTER — Other Ambulatory Visit: Payer: Self-pay | Admitting: Obstetrics and Gynecology

## 2022-01-12 ENCOUNTER — Encounter (HOSPITAL_COMMUNITY): Payer: Self-pay

## 2022-01-12 ENCOUNTER — Ambulatory Visit (HOSPITAL_COMMUNITY)
Admission: EM | Admit: 2022-01-12 | Discharge: 2022-01-12 | Disposition: A | Discharge status: Home / Self Care | Payer: Medicaid Other | Attending: Emergency Medicine | Admitting: Emergency Medicine

## 2022-01-12 DIAGNOSIS — A749 Chlamydial infection, unspecified: Secondary | ICD-10-CM | POA: Diagnosis not present

## 2022-01-12 MED ORDER — DOXYCYCLINE HYCLATE 100 MG PO CAPS: 100.0000 mg | ORAL_CAPSULE | Freq: Two times a day (BID) | ORAL | 0 refills | Status: DC

## 2022-01-12 NOTE — ED Triage Notes (Signed)
Patient states she was tested for STIs and was positive for chlamydia. States she was prescribed abx and didn't take the meds correctly due to her work schedule. Here for retest.

## 2022-01-12 NOTE — Discharge Instructions (Addendum)
Today you are being treated prophylactically for chlamydia  Take doxycycline every morning and every evening for 7 days, please take all medication, try setting an alarm and keeping pills with you at all times so that you may remember  Labs pending 2-3 days, you will be contacted if positive for any sti and treatment will be sent to the pharmacy, you will have to return to the clinic if positive for gonorrhea to receive treatment   Please refrain from having sex until labs results, if positive please refrain from having sex until treatment complete and symptoms resolve   If positive for  Chlamydia  gonorrhea or trichomoniasis please notify partner or partners so they may tested as well  Moving forward, it is recommended you use some form of protection against the transmission of sti infections  such as condoms or dental dams with each sexual encounter

## 2022-01-12 NOTE — ED Provider Notes (Signed)
[facility]    CSN: [id number] Arrival date & time: 01/12/22  1224      History   Chief Complaint Chief Complaint  Patient presents with   SEXUALLY TRANSMITTED DISEASE    HPI Sue Wiggins is a 21 y.o. female.   Patient presents for reevaluation of chlamydia.  Endorses that she tested positive around December 11, 2021, prescribe doxycycline.  Endorses that she stopped taking medication earlier than prescribed timeframe due to a hectic work schedule.  Initially she had no symptoms and still has not become symptomatic.  Endorses that she got testing due to infidelity of partner.    History reviewed. No pertinent past medical history.  Patient Active Problem List   Diagnosis Date Noted   Chlamydia infection 04/10/2021   Engages in [place] 04/08/2021    Past Surgical History:  Procedure Laterality Date   WISDOM TOOTH EXTRACTION  2016    OB History     Gravida  0   Para  0   Term  0   Preterm  0   AB  0   Living  0      SAB  0   IAB  0   Ectopic  0   Multiple  0   Live Births  0            Home Medications    Prior to Admission medications   Not on File    Family History Family History  Problem Relation Age of Onset   Healthy Mother     Social History Social History   Tobacco Use   Smoking status: Never   Smokeless tobacco: Never   Tobacco comments:    Is vaping non tobacco products  Vaping Use   Vaping Use: Every day  Substance Use Topics   Alcohol use: Yes    Comment: occasionally   Drug use: Yes    Types: Marijuana    Comment: occasionally     Allergies   Patient has no known allergies.   Review of Systems Review of Systems  Constitutional: Negative.   HENT: Negative.    Respiratory: Negative.    Cardiovascular: Negative.   Genitourinary: Negative.      Physical Exam Triage Vital Signs ED Triage Vitals [01/12/22 1356]  Enc Vitals Group     BP 125/73     Pulse Rate 83     Resp 16     Temp  98.7 F (37.1 C)     Temp Source Oral     SpO2 98 %     Weight      Height      Head Circumference      Peak Flow      Pain Score      Pain Loc      Pain Edu?      Excl. in [place]?    No data found.  Updated Vital Signs BP 125/73 (BP Location: Right Arm)   Pulse 83   Temp 98.7 F (37.1 C) (Oral)   Resp 16   LMP 12/20/2021 (Exact Date)   SpO2 98%   Visual Acuity Right Eye Distance:   Left Eye Distance:   Bilateral Distance:    Right Eye Near:   Left Eye Near:    Bilateral Near:     Physical Exam Constitutional:      Appearance: Normal appearance.  Eyes:     Extraocular Movements: Extraocular movements intact.  Pulmonary:     Effort: Pulmonary  effort is normal.  Genitourinary:    Comments: deferred Neurological:     Mental Status: She is alert and oriented to person, place, and time. Mental status is at baseline.  Psychiatric:        Mood and Affect: Mood normal.        Behavior: Behavior normal.      UC Treatments / Results  Labs (all labs ordered are listed, but only abnormal results are displayed) Labs Reviewed  CERVICOVAGINAL ANCILLARY ONLY    EKG   Radiology No results found.  Procedures Procedures (including critical care time)  Medications Ordered in UC Medications - No data to display  Initial Impression / Assessment and Plan / UC Course  I have reviewed the triage vital signs and the nursing notes.  Pertinent labs & imaging results that were available during my care of the patient were reviewed by me and considered in my medical decision making (see chart for details).  Chlamydia   We will treat prophylactically for chlamydia as she did not complete recent treatment, doxycycline prescribed, strongly advised patient to take all medication, advised using an alarm and keeping pills in close contact at all times to help with this, STI labs pending will treat per protocol, advised abstinence until lab results, and/or treatment is complete,  advised condom use during all sexual encounters moving, may follow-up with urgent care as needed   Final Clinical Impressions(s) / UC Diagnoses   Final diagnoses:  None   Discharge Instructions   None    ED Prescriptions   None    PDMP not reviewed this encounter.   Valinda Hoar, NP 01/12/22 1420

## 2022-01-13 ENCOUNTER — Telehealth (HOSPITAL_COMMUNITY): Payer: Self-pay | Admitting: Emergency Medicine

## 2022-01-13 LAB — CERVICOVAGINAL ANCILLARY ONLY
Bacterial Vaginitis (gardnerella): NEGATIVE
Candida Glabrata: NEGATIVE
Candida Vaginitis: POSITIVE — AB
Chlamydia: NEGATIVE
Comment: NEGATIVE
Comment: NEGATIVE
Comment: NEGATIVE
Comment: NEGATIVE
Comment: NEGATIVE
Comment: NORMAL
Neisseria Gonorrhea: NEGATIVE
Trichomonas: NEGATIVE

## 2022-01-13 MED ORDER — FLUCONAZOLE 150 MG PO TABS: 150.0000 mg | ORAL_TABLET | Freq: Once | ORAL | 0 refills | Status: AC

## 2022-01-14 ENCOUNTER — Ambulatory Visit: Payer: Medicaid Other | Admitting: Obstetrics

## 2022-01-20 DIAGNOSIS — F411 Generalized anxiety disorder: Secondary | ICD-10-CM | POA: Diagnosis not present

## 2022-01-20 DIAGNOSIS — F331 Major depressive disorder, recurrent, moderate: Secondary | ICD-10-CM | POA: Diagnosis not present

## 2022-01-28 DIAGNOSIS — F331 Major depressive disorder, recurrent, moderate: Secondary | ICD-10-CM | POA: Diagnosis not present

## 2022-01-28 DIAGNOSIS — F411 Generalized anxiety disorder: Secondary | ICD-10-CM | POA: Diagnosis not present

## 2022-02-04 DIAGNOSIS — F331 Major depressive disorder, recurrent, moderate: Secondary | ICD-10-CM | POA: Diagnosis not present

## 2022-02-04 DIAGNOSIS — F411 Generalized anxiety disorder: Secondary | ICD-10-CM | POA: Diagnosis not present

## 2022-02-11 ENCOUNTER — Ambulatory Visit (HOSPITAL_COMMUNITY)
Admission: EM | Admit: 2022-02-11 | Discharge: 2022-02-11 | Disposition: A | Discharge status: Home / Self Care | Payer: BC Managed Care – PPO | Attending: Family Medicine | Admitting: Family Medicine

## 2022-02-11 ENCOUNTER — Encounter (HOSPITAL_COMMUNITY): Payer: Self-pay | Admitting: Emergency Medicine

## 2022-02-11 DIAGNOSIS — F411 Generalized anxiety disorder: Secondary | ICD-10-CM | POA: Diagnosis not present

## 2022-02-11 DIAGNOSIS — Z113 Encounter for screening for infections with a predominantly sexual mode of transmission: Secondary | ICD-10-CM | POA: Insufficient documentation

## 2022-02-11 DIAGNOSIS — Z202 Contact with and (suspected) exposure to infections with a predominantly sexual mode of transmission: Secondary | ICD-10-CM | POA: Diagnosis not present

## 2022-02-11 DIAGNOSIS — F331 Major depressive disorder, recurrent, moderate: Secondary | ICD-10-CM | POA: Diagnosis not present

## 2022-02-11 NOTE — Discharge Instructions (Signed)
Staff will notify you of any positives on your swab

## 2022-02-11 NOTE — ED Triage Notes (Signed)
Pt presents for her "monthly" STD check up. Reports a "white clumpy discharge" for a couple days.

## 2022-02-11 NOTE — ED Provider Notes (Signed)
[facility]    CSN: [id number] Arrival date & time: 02/11/22  1720      History   Chief Complaint Chief Complaint  Patient presents with   SEXUALLY TRANSMITTED DISEASE    HPI Sue Wiggins is a 21 y.o. female.   HPI Here for testing for STDs.  She is having a little bit of white discharge but otherwise does not have any abdominal pain or dysuria.  Had HIV screening in May  Last menstrual cycle was October 14  History reviewed. No pertinent past medical history.  Patient Active Problem List   Diagnosis Date Noted   Chlamydia infection 04/10/2021   Engages in vaping 04/08/2021    Past Surgical History:  Procedure Laterality Date   WISDOM TOOTH EXTRACTION  2016    OB History     Gravida  0   Para  0   Term  0   Preterm  0   AB  0   Living  0      SAB  0   IAB  0   Ectopic  0   Multiple  0   Live Births  0            Home Medications    Prior to Admission medications   Medication Sig Start Date End Date Taking? Authorizing Provider  doxycycline (VIBRAMYCIN) 100 MG capsule Take 1 capsule (100 mg total) by mouth 2 (two) times daily. 01/12/22   Valinda Hoar, NP    Family History Family History  Problem Relation Age of Onset   Healthy Mother     Social History Social History   Tobacco Use   Smoking status: Never   Smokeless tobacco: Never   Tobacco comments:    Is vaping non tobacco products  Vaping Use   Vaping Use: Every day  Substance Use Topics   Alcohol use: Yes    Comment: occasionally   Drug use: Yes    Types: Marijuana    Comment: occasionally     Allergies   Patient has no known allergies.   Review of Systems Review of Systems   Physical Exam Triage Vital Signs ED Triage Vitals  Enc Vitals Group     BP 02/11/22 1819 133/81     Pulse Rate 02/11/22 1819 82     Resp 02/11/22 1819 18     Temp 02/11/22 1819 98.3 F (36.8 C)     Temp Source 02/11/22 1819 Oral     SpO2 02/11/22 1819 99 %      Weight --      Height --      Head Circumference --      Peak Flow --      Pain Score 02/11/22 1820 0     Pain Loc --      Pain Edu? --      Excl. in [place]? --    No data found.  Updated Vital Signs BP 133/81 (BP Location: Left Arm)   Pulse 82   Temp 98.3 F (36.8 C) (Oral)   Resp 18   LMP 12/20/2021 (Exact Date)   SpO2 99%   Visual Acuity Right Eye Distance:   Left Eye Distance:   Bilateral Distance:    Right Eye Near:   Left Eye Near:    Bilateral Near:     Physical Exam Vitals reviewed.  Constitutional:      General: She is not in acute distress.    Appearance: She is not  toxic-appearing.  Skin:    Coloration: Skin is not pale.  Neurological:     Mental Status: She is alert and oriented to person, place, and time.  Psychiatric:        Behavior: Behavior normal.      UC Treatments / Results  Labs (all labs ordered are listed, but only abnormal results are displayed) Labs Reviewed  CERVICOVAGINAL ANCILLARY ONLY    EKG   Radiology No results found.  Procedures Procedures (including critical care time)  Medications Ordered in UC Medications - No data to display  Initial Impression / Assessment and Plan / UC Course  I have reviewed the triage vital signs and the nursing notes.  Pertinent labs & imaging results that were available during my care of the patient were reviewed by me and considered in my medical decision making (see chart for details).        Swab is done and she will be notified and treated per protocol for any positives Final Clinical Impressions(s) / UC Diagnoses   Final diagnoses:  None   Discharge Instructions   None    ED Prescriptions   None    PDMP not reviewed this encounter.   Zenia Resides, MD 02/11/22 Paulo Fruit

## 2022-02-12 LAB — CERVICOVAGINAL ANCILLARY ONLY
Bacterial Vaginitis (gardnerella): NEGATIVE
Candida Glabrata: NEGATIVE
Candida Vaginitis: NEGATIVE
Chlamydia: NEGATIVE
Comment: NEGATIVE
Comment: NEGATIVE
Comment: NEGATIVE
Comment: NEGATIVE
Comment: NEGATIVE
Comment: NORMAL
Neisseria Gonorrhea: NEGATIVE
Trichomonas: NEGATIVE

## 2022-02-17 ENCOUNTER — Ambulatory Visit: Payer: Medicaid Other | Admitting: Obstetrics

## 2022-02-18 ENCOUNTER — Ambulatory Visit (INDEPENDENT_AMBULATORY_CARE_PROVIDER_SITE_OTHER): Payer: BC Managed Care – PPO | Admitting: Obstetrics

## 2022-02-18 ENCOUNTER — Encounter: Payer: Self-pay | Admitting: Obstetrics

## 2022-02-18 ENCOUNTER — Ambulatory Visit: Payer: BC Managed Care – PPO | Admitting: Obstetrics

## 2022-02-18 VITALS — BP 119/74 | HR 66 | Ht 61.0 in | Wt 131.0 lb

## 2022-02-18 DIAGNOSIS — Z30016 Encounter for initial prescription of transdermal patch hormonal contraceptive device: Secondary | ICD-10-CM

## 2022-02-18 DIAGNOSIS — Z3041 Encounter for surveillance of contraceptive pills: Secondary | ICD-10-CM | POA: Diagnosis not present

## 2022-02-18 DIAGNOSIS — Z3009 Encounter for other general counseling and advice on contraception: Secondary | ICD-10-CM

## 2022-02-18 LAB — POCT URINE PREGNANCY: Preg Test, Ur: NEGATIVE

## 2022-02-18 MED ORDER — XULANE 150-35 MCG/24HR TD PTWK: 1.0000 | MEDICATED_PATCH | TRANSDERMAL | 12 refills | Status: DC

## 2022-02-18 NOTE — Progress Notes (Signed)
Subjective:    Sue Wiggins is a 21 y.o. female who presents for contraception counseling. The patient has no complaints today. The patient is sexually active. Pertinent past medical history: sexually transmitted diseases.  The information documented in the HPI was reviewed and verified.  Menstrual History: OB History     Gravida  0   Para  0   Term  0   Preterm  0   AB  0   Living  0      SAB  0   IAB  0   Ectopic  0   Multiple  0   Live Births  0            Patient's last menstrual period was 02/12/2022 (exact date).   Patient Active Problem List   Diagnosis Date Noted   Chlamydia infection 04/10/2021   Engages in vaping 04/08/2021   History reviewed. No pertinent past medical history.  Past Surgical History:  Procedure Laterality Date   WISDOM TOOTH EXTRACTION  2016     Current Outpatient Medications:    norelgestromin-ethinyl estradiol [facility]) 150-35 MCG/24HR transdermal patch, Place 1 patch onto the skin once a week., Disp: 3 patch, Rfl: 12 No Known Allergies  Social History   Tobacco Use   Smoking status: Never   Smokeless tobacco: Current   Tobacco comments:    Is vaping non tobacco products  Substance Use Topics   Alcohol use: Yes    Comment: occasionally    Family History  Problem Relation Age of Onset   Healthy Mother        Review of Systems Constitutional: negative for weight loss Genitourinary:negative for abnormal menstrual periods and vaginal discharge   Objective:   BP 119/74   Pulse 66   Ht 5\' 1"  (1.549 m)   Wt 131 lb (59.4 kg)   LMP 02/12/2022 (Exact Date)   BMI 24.75 kg/m    General:   alert  Skin:   no rash or abnormalities  Lungs:   clear to auscultation bilaterally  Heart:   regular rate and rhythm, S1, S2 normal, no murmur, click, rub or gallop  Breasts:   normal without suspicious masses, skin or nipple changes or axillary nodes  Abdomen:  normal findings: no organomegaly, soft, non-tender and no  hernia  Pelvis:  External genitalia: normal general appearance Urinary system: urethral meatus normal and bladder without fullness, nontender Vaginal: normal without tenderness, induration or masses Cervix: normal appearance Adnexa: normal bimanual exam Uterus: anteverted and non-tender, normal size   Lab Review Urine pregnancy test Labs reviewed yes Radiologic studies reviewed no  I have spent a total of 15 minutes of face-to-face and non-face-to-face time, excluding clinical staff time, reviewing notes and preparing to see patient, ordering tests and/or medications, and counseling the patient.   Assessment:    21 y.o., discontinuing OCP (estrogen/progesterone), no contraindications.   Plan:   1. Encounter for surveillance of contraceptive pills - wants to discontinue the pill  2. Encounter for other general counseling or advice on contraception - wants to start the Xulane Transdermal Patch  3. Encounter for initial prescription of transdermal patch hormonal contraceptive device Rx:   - POCT urine pregnancy:  NEGATIVE - norelgestromin-ethinyl estradiol 21) 150-35 MCG/24HR transdermal patch; Place 1 patch onto the skin once a week.  Dispense: 3 patch; Refill: 12     All questions answered. Contraception: [facility]. Discussed healthy lifestyle modifications. Financial risk analyst distributed. Follow up in 3 months.   Meds ordered  this encounter  Medications   norelgestromin-ethinyl estradiol [facility]) 150-35 MCG/24HR transdermal patch    Sig: Place 1 patch onto the skin once a week.    Dispense:  3 patch    Refill:  12   Orders Placed This Encounter  Procedures   POCT urine pregnancy   Brock Bad, MD 02/18/2022 4:43 PM

## 2022-02-18 NOTE — Progress Notes (Signed)
21 y.o GYN presents for [facility] Consult, she is on the Pill which she has taken this Month.  Last unprotected sex was this Morning.  Pt wants to try the Patch.  UPT today is Negative.

## 2022-02-26 ENCOUNTER — Other Ambulatory Visit: Payer: Self-pay

## 2022-02-26 ENCOUNTER — Emergency Department (HOSPITAL_COMMUNITY)
Admission: EM | Admit: 2022-02-26 | Discharge: 2022-02-26 | Disposition: A | Discharge status: Home / Self Care | Payer: BC Managed Care – PPO | Attending: Emergency Medicine | Admitting: Emergency Medicine

## 2022-02-26 DIAGNOSIS — J101 Influenza due to other identified influenza virus with other respiratory manifestations: Secondary | ICD-10-CM | POA: Diagnosis not present

## 2022-02-26 DIAGNOSIS — R509 Fever, unspecified: Secondary | ICD-10-CM | POA: Diagnosis present

## 2022-02-26 DIAGNOSIS — Z1152 Encounter for screening for COVID-19: Secondary | ICD-10-CM | POA: Diagnosis not present

## 2022-02-26 DIAGNOSIS — J111 Influenza due to unidentified influenza virus with other respiratory manifestations: Secondary | ICD-10-CM

## 2022-02-26 LAB — RESP PANEL BY RT-PCR (FLU A&B, COVID) ARPGX2
Influenza A by PCR: POSITIVE — AB
Influenza B by PCR: NEGATIVE
SARS Coronavirus 2 by RT PCR: NEGATIVE

## 2022-02-26 MED ORDER — OSELTAMIVIR PHOSPHATE 75 MG PO CAPS: 75.0000 mg | ORAL_CAPSULE | Freq: Two times a day (BID) | ORAL | 0 refills | Status: DC

## 2022-02-26 MED ORDER — BENZONATATE 100 MG PO CAPS: 100.0000 mg | ORAL_CAPSULE | Freq: Three times a day (TID) | ORAL | 0 refills | Status: DC

## 2022-02-26 NOTE — Discharge Instructions (Signed)
You tested positive for the flu.  You will be contagious to others, please stay out of work for the next 3 days.  Use the Tessalon Perles for coughing every 8 hours as needed, take Tamiflu twice a day for 5 days to help reduce the chance that you spread the flu and to slightly improve your symptoms.  Please expect to be sick for about 7 days.  You may use DayQuil or NyQuil as well for the decongestant and the Tylenol and the cough suppressant.  ER for severe or worsening symptoms

## 2022-02-26 NOTE — ED Provider Notes (Signed)
[facility] EMERGENCY DEPARTMENT Provider Note   CSN: [id number] Arrival date & time: 02/26/22  1523     History  Chief Complaint  Patient presents with   URI    Sue Wiggins is a 21 y.o. female.   URI Presenting symptoms: cough, fatigue and fever   Associated symptoms: myalgias    This patient is a 21 year old female, she reports that she is otherwise healthy and has no chronic medical conditions, she has been sick for approximately 48 hours which she reports is having a cough which is now developed into fevers and body aches and some nasal congestion.  She lives by herself, she works in the [employer], she has no other chronic medical conditions and other than birth control pills does not take any other daily medicines.  She has been taking some over-the-counter medications with minimal improvement.    Home Medications Prior to Admission medications   Medication Sig Start Date End Date Taking? Authorizing Provider  benzonatate (TESSALON) 100 MG capsule Take 1 capsule (100 mg total) by mouth every 8 (eight) hours. 02/26/22  Yes Eber Hong, MD  oseltamivir (TAMIFLU) 75 MG capsule Take 1 capsule (75 mg total) by mouth every 12 (twelve) hours. 02/26/22  Yes Eber Hong, MD  norelgestromin-ethinyl estradiol [facility]) 150-35 MCG/24HR transdermal patch Place 1 patch onto the skin once a week. 02/18/22   Brock Bad, MD      Allergies    Patient has no known allergies.    Review of Systems   Review of Systems  Constitutional:  Positive for chills, fatigue and fever.  Respiratory:  Positive for cough.   Musculoskeletal:  Positive for myalgias.  Neurological:  Negative for weakness and numbness.    Physical Exam Updated Vital Signs BP 127/89 (BP Location: Right Arm)   Pulse (!) 101   Temp 100 F (37.8 C) (Oral)   Resp 20   LMP 02/12/2022 (Exact Date)   SpO2 97%  Physical Exam Vitals and nursing note reviewed.  Constitutional:       Appearance: She is well-developed. She is not diaphoretic.  HENT:     Head: Normocephalic and atraumatic.     Nose: Congestion and rhinorrhea present.     Mouth/Throat:     Mouth: Mucous membranes are moist.  Eyes:     General:        Right eye: No discharge.        Left eye: No discharge.     Conjunctiva/sclera: Conjunctivae normal.  Cardiovascular:     Rate and Rhythm: Normal rate.     Heart sounds: No murmur heard. Pulmonary:     Effort: Pulmonary effort is normal. No respiratory distress.     Breath sounds: No wheezing or rhonchi.  Musculoskeletal:     Cervical back: No rigidity.     Right lower leg: No edema.     Left lower leg: No edema.  Lymphadenopathy:     Cervical: No cervical adenopathy.  Skin:    General: Skin is warm and dry.     Findings: No erythema or rash.  Neurological:     Mental Status: She is alert.     Coordination: Coordination normal.     ED Results / Procedures / Treatments   Labs (all labs ordered are listed, but only abnormal results are displayed) Labs Reviewed  RESP PANEL BY RT-PCR (FLU A&B, COVID) ARPGX2 - Abnormal; Notable for the following components:      Result Value  Influenza A by PCR POSITIVE (*)    All other components within normal limits    EKG None  Radiology No results found.  Procedures Procedures    Medications Ordered in ED Medications - No data to display  ED Course/ Medical Decision Making/ A&P                           Medical Decision Making Risk Prescription drug management.   This patient presents to the ED for concern of upper respiratory symptoms differential diagnosis includes viral upper respiratory illness, seems less likely to be pneumonia given that she has nasal congestion drainage and a bit of a sore throat    Additional history obtained:  Additional history obtained from electronic medical record External records from outside source obtained and reviewed including recent treatment for  STD   Lab Tests:  I Ordered, and personally interpreted labs.  The pertinent results include: COVID and flu testing   Imaging Studies ordered:  None   Medicines ordered and prescription drug management:  I ordered medication including Tamiflu and benzonatate for home to treat the influenza and the cough, it has been less than 48 hours Reevaluation of the patient after these medicines showed that the patient patient is stable in the emergency department I have reviewed the patients home medicines and have made adjustments as needed   Problem List / ED Course:  Influenza, stable, vital signs unremarkable   Social Determinants of Health:  Patient informed of her diagnosis, stable for discharge           Final Clinical Impression(s) / ED Diagnoses Final diagnoses:  Influenza    Rx / DC Orders ED Discharge Orders          Ordered    oseltamivir (TAMIFLU) 75 MG capsule  Every 12 hours        02/26/22 1637    benzonatate (TESSALON) 100 MG capsule  Every 8 hours        02/26/22 1637              Eber Hong, MD 02/26/22 [phone]

## 2022-02-26 NOTE — ED Triage Notes (Signed)
Pt reports sneezing, cough, runny nose for a few days. Worse today.  Taking tylenol at home.  No known sick contacts.

## 2022-02-26 NOTE — ED Notes (Signed)
Pt d/c home per MD order. Discharge summary reviewed, pt verbalizes understanding. Ambulatory off unit. No s/s of acute distress noted at discharge.  °

## 2022-04-08 DIAGNOSIS — F3181 Bipolar II disorder: Secondary | ICD-10-CM | POA: Diagnosis not present

## 2022-04-15 DIAGNOSIS — F319 Bipolar disorder, unspecified: Secondary | ICD-10-CM | POA: Diagnosis not present

## 2022-04-15 DIAGNOSIS — R591 Generalized enlarged lymph nodes: Secondary | ICD-10-CM | POA: Diagnosis not present

## 2022-04-15 DIAGNOSIS — F3181 Bipolar II disorder: Secondary | ICD-10-CM | POA: Diagnosis not present

## 2022-04-15 DIAGNOSIS — F439 Reaction to severe stress, unspecified: Secondary | ICD-10-CM | POA: Diagnosis not present

## 2022-04-16 DIAGNOSIS — R591 Generalized enlarged lymph nodes: Secondary | ICD-10-CM | POA: Diagnosis not present

## 2022-04-16 DIAGNOSIS — F3181 Bipolar II disorder: Secondary | ICD-10-CM | POA: Diagnosis not present

## 2022-04-21 ENCOUNTER — Emergency Department (HOSPITAL_COMMUNITY)
Admission: EM | Admit: 2022-04-21 | Discharge: 2022-04-22 | Discharge status: Against Medical Advice | Payer: BC Managed Care – PPO

## 2022-04-21 NOTE — ED Notes (Signed)
Pt stated she is not waiting and is leaving.

## 2022-04-22 DIAGNOSIS — F3181 Bipolar II disorder: Secondary | ICD-10-CM | POA: Diagnosis not present

## 2022-04-30 DIAGNOSIS — F411 Generalized anxiety disorder: Secondary | ICD-10-CM | POA: Diagnosis not present

## 2022-04-30 DIAGNOSIS — F3162 Bipolar disorder, current episode mixed, moderate: Secondary | ICD-10-CM | POA: Diagnosis not present

## 2022-05-13 DIAGNOSIS — F411 Generalized anxiety disorder: Secondary | ICD-10-CM | POA: Diagnosis not present

## 2022-05-13 DIAGNOSIS — F3162 Bipolar disorder, current episode mixed, moderate: Secondary | ICD-10-CM | POA: Diagnosis not present

## 2022-05-20 DIAGNOSIS — F411 Generalized anxiety disorder: Secondary | ICD-10-CM | POA: Diagnosis not present

## 2022-05-20 DIAGNOSIS — F3162 Bipolar disorder, current episode mixed, moderate: Secondary | ICD-10-CM | POA: Diagnosis not present

## 2022-05-27 DIAGNOSIS — F3162 Bipolar disorder, current episode mixed, moderate: Secondary | ICD-10-CM | POA: Diagnosis not present

## 2022-05-27 DIAGNOSIS — F411 Generalized anxiety disorder: Secondary | ICD-10-CM | POA: Diagnosis not present

## 2022-06-03 DIAGNOSIS — F411 Generalized anxiety disorder: Secondary | ICD-10-CM | POA: Diagnosis not present

## 2022-06-03 DIAGNOSIS — F3162 Bipolar disorder, current episode mixed, moderate: Secondary | ICD-10-CM | POA: Diagnosis not present

## 2022-07-06 ENCOUNTER — Ambulatory Visit: Payer: BC Managed Care – PPO | Admitting: Obstetrics

## 2022-07-08 ENCOUNTER — Ambulatory Visit (HOSPITAL_COMMUNITY)
Admission: EM | Admit: 2022-07-08 | Discharge: 2022-07-08 | Disposition: A | Discharge status: Home / Self Care | Payer: BC Managed Care – PPO | Attending: Internal Medicine | Admitting: Internal Medicine

## 2022-07-08 ENCOUNTER — Encounter (HOSPITAL_COMMUNITY): Payer: Self-pay

## 2022-07-08 DIAGNOSIS — N946 Dysmenorrhea, unspecified: Secondary | ICD-10-CM | POA: Insufficient documentation

## 2022-07-08 DIAGNOSIS — R300 Dysuria: Secondary | ICD-10-CM | POA: Diagnosis not present

## 2022-07-08 DIAGNOSIS — Z3202 Encounter for pregnancy test, result negative: Secondary | ICD-10-CM | POA: Insufficient documentation

## 2022-07-08 DIAGNOSIS — R102 Pelvic and perineal pain: Secondary | ICD-10-CM | POA: Insufficient documentation

## 2022-07-08 LAB — POCT URINALYSIS DIPSTICK, ED / UC
Bilirubin Urine: NEGATIVE
Glucose, UA: NEGATIVE mg/dL
Ketones, ur: NEGATIVE mg/dL
Leukocytes,Ua: NEGATIVE
Nitrite: NEGATIVE
Protein, ur: NEGATIVE mg/dL
Specific Gravity, Urine: 1.025 (ref 1.005–1.030)
Urobilinogen, UA: 1 mg/dL (ref 0.0–1.0)
pH: 6.5 (ref 5.0–8.0)

## 2022-07-08 LAB — POC URINE PREG, ED: Preg Test, Ur: NEGATIVE

## 2022-07-08 LAB — HIV ANTIBODY (ROUTINE TESTING W REFLEX): HIV Screen 4th Generation wRfx: NONREACTIVE

## 2022-07-08 MED ORDER — IBUPROFEN 800 MG PO TABS
ORAL_TABLET | ORAL | Status: AC
  Filled 2022-07-08: qty 1

## 2022-07-08 MED ORDER — IBUPROFEN 800 MG PO TABS
800.0000 mg | ORAL_TABLET | Freq: Once | ORAL | Status: AC
  Administered 2022-07-08: 800 mg via ORAL

## 2022-07-08 NOTE — ED Triage Notes (Signed)
Abdominal pain onset Monday night, worse last night. No NVD. Patient is light headed with standing. Pain in the Suprapubic area and going up. Pain with urination after sex on Monday night.  No itching, burning, or vaginal discharge. Period started last night.

## 2022-07-08 NOTE — Discharge Instructions (Signed)
Your STD testing has been sent to the lab and will come back in the next 2 to 3 days.  We will call you if any of your results are positive requiring treatment and treat you at that time.   If you do not receive a phone call from [place], this means your testing was negative.  Pelvic pain is likely due to menstrual cramping. Please start taking ibuprofen 800mg  every 8 hours as needed for muscle cramping.  You may apply heat to the abdomen 20 minutes on 20 minutes off to reduce inflammation and pain to the uterine/pelvic space.  Avoid sexual intercourse until your STD results come back.  If any of your STD results are positive, you will need to avoid sexual intercourse for 7 days while you are being treated to prevent spread of STD.  Condom use is the best way to prevent spread of STDs.  Return to urgent care as needed.

## 2022-07-08 NOTE — ED Provider Notes (Signed)
[place]    CSN: [id number] Arrival date & time: 07/08/22  1614      History   Chief Complaint Chief Complaint  Patient presents with   Abdominal Pain    HPI Kerena Pizzola is a 22 y.o. female.   Patient presents to urgent care for evaluation of dysuria and pelvic pain that started 2 days ago. States she first experienced dysuria after intercourse with female partner 2 days ago but has not experienced this since. Her menstrual cycle started yesterday and she reports intense pelvic pain and menstrual cramping that she states is worse than normal. Abdominal pain is mostly to the left lower quadrant and the suprapubic area and is worse with movement. States urinating helps with pain. She has been using Pamprin and Midol for menstrual cramps without relief. She has not attempted use of heat or ibuprofen and has not had any medicines to help with the pain since yesterday. No fever, chills, diarrhea, constipation, flank pain, nausea, vomiting, or viral URI symptoms. Reports mild dizziness with standing but states this happens every time she has her menstrual cycle. Reports recent new sexual partner since she was last checked for STDs in November 2023. Would like STD testing today. Denies urinary frequency, urgency, and odor. No vaginal rash, odor, itch, or discharge.    Abdominal Pain   History reviewed. No pertinent past medical history.  Patient Active Problem List   Diagnosis Date Noted   Chlamydia infection 04/10/2021   Engages in [place] 04/08/2021    Past Surgical History:  Procedure Laterality Date   WISDOM TOOTH EXTRACTION  2016    OB History     Gravida  0   Para  0   Term  0   Preterm  0   AB  0   Living  0      SAB  0   IAB  0   Ectopic  0   Multiple  0   Live Births  0            Home Medications    Prior to Admission medications   Medication Sig Start Date End Date Taking? Authorizing Provider  norelgestromin-ethinyl estradiol  Marilu Favre) 150-35 MCG/24HR transdermal patch Place 1 patch onto the skin once a week. 02/18/22  Yes Shelly Bombard, MD  benzonatate (TESSALON) 100 MG capsule Take 1 capsule (100 mg total) by mouth every 8 (eight) hours. 02/26/22   Noemi Chapel, MD  oseltamivir (TAMIFLU) 75 MG capsule Take 1 capsule (75 mg total) by mouth every 12 (twelve) hours. 02/26/22   Noemi Chapel, MD    Family History Family History  Problem Relation Age of Onset   Healthy Mother     Social History Social History   Tobacco Use   Smoking status: Never   Smokeless tobacco: Current   Tobacco comments:    Is vaping non tobacco products  Vaping Use   Vaping Use: Every day  Substance Use Topics   Alcohol use: Yes    Comment: occasionally   Drug use: Yes    Types: Marijuana    Comment: occasionally     Allergies   Patient has no known allergies.   Review of Systems Review of Systems  Gastrointestinal:  Positive for abdominal pain.  Per HPI   Physical Exam Triage Vital Signs ED Triage Vitals  Enc Vitals Group     BP 07/08/22 1723 117/75     Pulse Rate 07/08/22 1723 88  Resp 07/08/22 1723 18     Temp 07/08/22 1723 98 F (36.7 C)     Temp Source 07/08/22 1723 Oral     SpO2 07/08/22 1723 98 %     Weight 07/08/22 1722 120 lb (54.4 kg)     Height 07/08/22 1722 5\' 1"  (1.549 m)     Head Circumference --      Peak Flow --      Pain Score 07/08/22 1721 7     Pain Loc --      Pain Edu? --      Excl. in [place]? --    No data found.  Updated Vital Signs BP 117/75 (BP Location: Left Arm)   Pulse 88   Temp 98 F (36.7 C) (Oral)   Resp 18   Ht 5\' 1"  (1.549 m)   Wt 120 lb (54.4 kg)   LMP 07/07/2022 (Exact Date)   SpO2 98%   BMI 22.67 kg/m   Visual Acuity Right Eye Distance:   Left Eye Distance:   Bilateral Distance:    Right Eye Near:   Left Eye Near:    Bilateral Near:     Physical Exam Vitals and nursing note reviewed.  Constitutional:      Appearance: She is not ill-appearing  or toxic-appearing.  HENT:     Head: Normocephalic and atraumatic.     Right Ear: Hearing, tympanic membrane, ear canal and external ear normal.     Left Ear: Hearing, tympanic membrane, ear canal and external ear normal.     Nose: Nose normal.     Mouth/Throat:     Lips: Pink.     Mouth: Mucous membranes are moist. No injury.     Tongue: No lesions. Tongue does not deviate from midline.     Palate: No mass and lesions.     Pharynx: Oropharynx is clear. Uvula midline. No pharyngeal swelling, oropharyngeal exudate, posterior oropharyngeal erythema or uvula swelling.     Tonsils: No tonsillar exudate or tonsillar abscesses.  Eyes:     General: Lids are normal. Vision grossly intact. Gaze aligned appropriately.     Extraocular Movements: Extraocular movements intact.     Conjunctiva/sclera: Conjunctivae normal.  Cardiovascular:     Rate and Rhythm: Normal rate and regular rhythm.     Heart sounds: Normal heart sounds, S1 normal and S2 normal.  Pulmonary:     Effort: Pulmonary effort is normal. No respiratory distress.     Breath sounds: Normal breath sounds and air entry.  Abdominal:     General: Abdomen is flat. Bowel sounds are normal.     Palpations: Abdomen is soft.     Tenderness: There is abdominal tenderness in the suprapubic area and left lower quadrant. There is no right CVA tenderness, left CVA tenderness, guarding or rebound. Negative signs include Murphy's sign, Rovsing's sign and McBurney's sign.     Comments: No peritoneal signs.  Musculoskeletal:     Cervical back: Neck supple.  Skin:    General: Skin is warm and dry.     Capillary Refill: Capillary refill takes less than 2 seconds.     Findings: No rash.  Neurological:     General: No focal deficit present.     Mental Status: She is alert and oriented to person, place, and time. Mental status is at baseline.     Cranial Nerves: No dysarthria or facial asymmetry.  Psychiatric:        Mood and Affect: Mood normal.  Speech: Speech normal.        Behavior: Behavior normal.        Thought Content: Thought content normal.        Judgment: Judgment normal.      UC Treatments / Results  Labs (all labs ordered are listed, but only abnormal results are displayed) Labs Reviewed  POCT URINALYSIS DIPSTICK, ED / UC - Abnormal; Notable for the following components:      Result Value   Hgb urine dipstick SMALL (*)    All other components within normal limits  HIV ANTIBODY (ROUTINE TESTING W REFLEX)  RPR  POC URINE PREG, ED  CERVICOVAGINAL ANCILLARY ONLY    EKG   Radiology No results found.  Procedures Procedures (including critical care time)  Medications Ordered in UC Medications  ibuprofen (ADVIL) tablet 800 mg (800 mg Oral Given 07/08/22 1951)    Initial Impression / Assessment and Plan / UC Course  I have reviewed the triage vital signs and the nursing notes.  Pertinent labs & imaging results that were available during my care of the patient were reviewed by me and considered in my medical decision making (see chart for details).   1. Pelvic pain in female, dysuria, menstrual cramps, negative pregnancy test Pelvic pain likely due to menstrual cramping. Abdominal exam is without peritoneal signs. Ibuprofen 800mg  given in clinic, may use this every 8 hours as needed for menstrual cramping at home as well. Advised to purchase heating pad and use this 20 minutes on, 20 minutes off to the abdomen/low back as needed for menstrual cramps.   Urine pregnancy is negative. Urinalysis unremarkable for signs of urinary tract infection. Advised to continue to push fluids to stay well hydrated.   STI labs pending.  Patient would like HIV and syphilis testing today.  Will notify patient of positive results and treat accordingly when labs come back.  Patient to avoid sexual intercourse until screening testing comes back.  Education provided regarding safe sexual practices and patient encouraged to use  protection to prevent spread of STIs.   Discussed physical exam and available lab work findings in clinic with patient.  Counseled patient regarding appropriate use of medications and potential side effects for all medications recommended or prescribed today. Discussed red flag signs and symptoms of worsening condition,when to call the PCP office, return to urgent care, and when to seek higher level of care in the emergency department. Patient verbalizes understanding and agreement with plan. All questions answered. Patient discharged in stable condition.    Final Clinical Impressions(s) / UC Diagnoses   Final diagnoses:  Pelvic pain in female  Dysuria  Menstrual cramps  Negative pregnancy test     Discharge Instructions      Your STD testing has been sent to the lab and will come back in the next 2 to 3 days.  We will call you if any of your results are positive requiring treatment and treat you at that time.   If you do not receive a phone call from [place], this means your testing was negative.  Pelvic pain is likely due to menstrual cramping. Please start taking ibuprofen 800mg  every 8 hours as needed for muscle cramping.  You may apply heat to the abdomen 20 minutes on 20 minutes off to reduce inflammation and pain to the uterine/pelvic space.  Avoid sexual intercourse until your STD results come back.  If any of your STD results are positive, you will need to avoid sexual intercourse for  7 days while you are being treated to prevent spread of STD.  Condom use is the best way to prevent spread of STDs.  Return to urgent care as needed.      ED Prescriptions   None    PDMP not reviewed this encounter.   Talbot Grumbling, [place] 07/08/22 2140

## 2022-07-09 DIAGNOSIS — F439 Reaction to severe stress, unspecified: Secondary | ICD-10-CM | POA: Diagnosis not present

## 2022-07-09 DIAGNOSIS — F319 Bipolar disorder, unspecified: Secondary | ICD-10-CM | POA: Diagnosis not present

## 2022-07-09 LAB — CERVICOVAGINAL ANCILLARY ONLY
Bacterial Vaginitis (gardnerella): POSITIVE — AB
Candida Glabrata: NEGATIVE
Candida Vaginitis: POSITIVE — AB
Chlamydia: NEGATIVE
Comment: NEGATIVE
Comment: NEGATIVE
Comment: NEGATIVE
Comment: NEGATIVE
Comment: NEGATIVE
Comment: NORMAL
Neisseria Gonorrhea: NEGATIVE
Trichomonas: NEGATIVE

## 2022-07-09 LAB — RPR: RPR Ser Ql: NONREACTIVE

## 2022-07-13 ENCOUNTER — Telehealth (HOSPITAL_COMMUNITY): Payer: Self-pay | Admitting: Emergency Medicine

## 2022-07-13 MED ORDER — FLUCONAZOLE 150 MG PO TABS: 150.0000 mg | ORAL_TABLET | Freq: Once | ORAL | 0 refills | Status: AC

## 2022-07-13 MED ORDER — METRONIDAZOLE 500 MG PO TABS: 500.0000 mg | ORAL_TABLET | Freq: Two times a day (BID) | ORAL | 0 refills | Status: DC

## 2022-07-20 ENCOUNTER — Encounter (HOSPITAL_COMMUNITY): Payer: Self-pay | Admitting: Emergency Medicine

## 2022-07-20 ENCOUNTER — Ambulatory Visit (HOSPITAL_COMMUNITY)
Admission: EM | Admit: 2022-07-20 | Discharge: 2022-07-20 | Disposition: A | Discharge status: Home / Self Care | Payer: BC Managed Care – PPO | Attending: Physician Assistant | Admitting: Physician Assistant

## 2022-07-20 DIAGNOSIS — Z1152 Encounter for screening for COVID-19: Secondary | ICD-10-CM | POA: Diagnosis not present

## 2022-07-20 DIAGNOSIS — J069 Acute upper respiratory infection, unspecified: Secondary | ICD-10-CM | POA: Insufficient documentation

## 2022-07-20 DIAGNOSIS — R051 Acute cough: Secondary | ICD-10-CM | POA: Diagnosis not present

## 2022-07-20 MED ORDER — ALBUTEROL SULFATE HFA 108 (90 BASE) MCG/ACT IN AERS
2.0000 | INHALATION_SPRAY | Freq: Once | RESPIRATORY_TRACT | Status: AC
  Administered 2022-07-20: 2 via RESPIRATORY_TRACT

## 2022-07-20 MED ORDER — PROMETHAZINE-DM 6.25-15 MG/5ML PO SYRP: 5.0000 mL | ORAL_SOLUTION | Freq: Three times a day (TID) | ORAL | 0 refills | Status: DC | PRN

## 2022-07-20 MED ORDER — ALBUTEROL SULFATE HFA 108 (90 BASE) MCG/ACT IN AERS
INHALATION_SPRAY | RESPIRATORY_TRACT | Status: AC
  Filled 2022-07-20: qty 6.7

## 2022-07-20 NOTE — ED Provider Notes (Signed)
[facility]    CSN: [id number] Arrival date & time: 07/20/22  1255      History   Chief Complaint Chief Complaint  Patient presents with   Cough    HPI Sue Wiggins is a 22 y.o. female.   Patient presents today with a 3-day history of URI symptoms.  She reports cough, rhinorrhea, sore throat, headache, fatigue, malaise.  Denies any fever, chest pain, nausea, vomiting, diarrhea.  Denies any known sick contacts.  She has taken her allergy medication without improvement of symptoms.  Denies any significant past medical history including formal diagnosis of allergies or asthma.  She smokes marijuana and vapes but denies tobacco abuse.  Denies any recent antibiotics or steroids.  She is confident that she is not pregnant.  She has not had COVID in the past.  She has not had COVID-19 vaccinations she has been taking over-the-counter medications with minimal improvement of symptoms.    History reviewed. No pertinent past medical history.  Patient Active Problem List   Diagnosis Date Noted   Chlamydia infection 04/10/2021   Engages in vaping 04/08/2021    Past Surgical History:  Procedure Laterality Date   WISDOM TOOTH EXTRACTION  2016    OB History     Gravida  0   Para  0   Term  0   Preterm  0   AB  0   Living  0      SAB  0   IAB  0   Ectopic  0   Multiple  0   Live Births  0            Home Medications    Prior to Admission medications   Medication Sig Start Date End Date Taking? Authorizing Provider  hydrOXYzine (ATARAX) 10 MG tablet Take 10 mg by mouth 2 (two) times daily. 07/10/22  Yes [provider]  lamoTRIgine (LAMICTAL) 25 MG tablet Take by mouth daily. Patient has not started this yet, but has picked it up today and plans to start taking   Yes [provider]  promethazine-dextromethorphan (PROMETHAZINE-DM) 6.25-15 MG/5ML syrup Take 5 mLs by mouth 3 (three) times daily as needed for cough. 07/20/22  Yes  Philamena Kramar K, PA-C  traZODone (DESYREL) 50 MG tablet Take 50 mg by mouth at bedtime as needed. 05/08/22  Yes [provider]  benzonatate (TESSALON) 100 MG capsule Take 1 capsule (100 mg total) by mouth every 8 (eight) hours. Patient not taking: Reported on 07/20/2022 02/26/22   Eber Hong, MD  metroNIDAZOLE (FLAGYL) 500 MG tablet Take 1 tablet (500 mg total) by mouth 2 (two) times daily. 07/13/22   LampteyBritta Mccreedy, MD  norelgestromin-ethinyl estradiol [facility]ico) 150-35 MCG/24HR transdermal patch Place 1 patch onto the skin once a week. 02/18/22   Brock Bad, MD  oseltamivir (TAMIFLU) 75 MG capsule Take 1 capsule (75 mg total) by mouth every 12 (twelve) hours. Patient not taking: Reported on 07/20/2022 02/26/22   Eber Hong, MD    Family History Family History  Problem Relation Age of Onset   Healthy Mother     Social History Social History   Tobacco Use   Smoking status: Never   Smokeless tobacco: Current   Tobacco comments:    Is vaping non tobacco products  Vaping Use   Vaping Use: Every day  Substance Use Topics   Alcohol use: Yes    Comment: occasionally   Drug use: Yes    Types: Marijuana  Comment: occasionally     Allergies   Patient has no known allergies.   Review of Systems Review of Systems  Constitutional:  Positive for activity change. Negative for appetite change, fatigue and fever.  HENT:  Positive for congestion, postnasal drip, sinus pressure and sore throat. Negative for sneezing.   Respiratory:  Positive for cough and shortness of breath.   Cardiovascular:  Negative for chest pain.  Gastrointestinal:  Negative for abdominal pain, diarrhea, nausea and vomiting.  Neurological:  Negative for dizziness, light-headedness and headaches.     Physical Exam Triage Vital Signs ED Triage Vitals  Enc Vitals Group     BP 07/20/22 1452 119/87     Pulse Rate 07/20/22 1452 86     Resp 07/20/22 1452 18     Temp 07/20/22 1452 98.4 F (36.9  C)     Temp Source 07/20/22 1452 Oral     SpO2 07/20/22 1452 99 %     Weight --      Height --      Head Circumference --      Peak Flow --      Pain Score 07/20/22 1446 4     Pain Loc --      Pain Edu? --      Excl. in [place]? --    No data found.  Updated Vital Signs BP 119/87 (BP Location: Right Arm)   Pulse 86   Temp 98.4 F (36.9 C) (Oral)   Resp 18   LMP 07/07/2022 (Exact Date) Comment: birth control patch  SpO2 99%   Visual Acuity Right Eye Distance:   Left Eye Distance:   Bilateral Distance:    Right Eye Near:   Left Eye Near:    Bilateral Near:     Physical Exam Vitals reviewed.  Constitutional:      General: She is awake. She is not in acute distress.    Appearance: Normal appearance. She is well-developed. She is not ill-appearing.     Comments: Very pleasant female appears stated age in no acute distress sitting comfortably exam room  HENT:     Head: Normocephalic and atraumatic.     Right Ear: Tympanic membrane, ear canal and external ear normal. Tympanic membrane is not erythematous or bulging.     Left Ear: Tympanic membrane, ear canal and external ear normal. Tympanic membrane is not erythematous or bulging.     Nose:     Right Sinus: No maxillary sinus tenderness or frontal sinus tenderness.     Left Sinus: No maxillary sinus tenderness or frontal sinus tenderness.     Mouth/Throat:     Pharynx: Uvula midline. Posterior oropharyngeal erythema present. No oropharyngeal exudate.  Cardiovascular:     Rate and Rhythm: Normal rate and regular rhythm.     Heart sounds: Normal heart sounds, S1 normal and S2 normal. No murmur heard. Pulmonary:     Effort: Pulmonary effort is normal.     Breath sounds: Examination of the right-lower field reveals decreased breath sounds. Examination of the left-lower field reveals decreased breath sounds. Decreased breath sounds present. No wheezing, rhonchi or rales.  Psychiatric:        Behavior: Behavior is cooperative.       UC Treatments / Results  Labs (all labs ordered are listed, but only abnormal results are displayed) Labs Reviewed  SARS CORONAVIRUS 2 (TAT 6-24 HRS)    EKG   Radiology No results found.  Procedures Procedures (including critical care time)  Medications Ordered in UC Medications  albuterol (VENTOLIN HFA) 108 (90 Base) MCG/ACT inhaler 2 puff (2 puffs Inhalation Given 07/20/22 1532)    Initial Impression / Assessment and Plan / UC Course  I have reviewed the triage vital signs and the nursing notes.  Pertinent labs & imaging results that were available during my care of the patient were reviewed by me and considered in my medical decision making (see chart for details).     Patient is well-appearing, afebrile, nontoxic, nontachycardic.  COVID testing was obtained and is pending.  Flu testing was deferred as she has been symptomatic for several days so outside the window of effectiveness for Tamiflu.  Discussed likely viral etiology versus allergies is contributing to her symptoms.  She was encouraged to continue over-the-counter antihistamines as needed.  Recommended she use over-the-counter medication including Mucinex, Flonase, Tylenol.  She was given 2 puffs of albuterol in clinic with improvement of symptoms.  She was at home with this medication to use every 4-6 hours as needed.  She was prescribed with using DM for cough.  Discussed that this can be sedating and she is not to drive or drink alcohol with taking it.  She is to rest and drink plenty of fluids.  Discussed that if her symptoms or not improving within a week she is to return for reevaluation.  If she has any worsening symptoms including worsening cough, shortness of breath, fever, nausea, vomiting, weakness she needs to be seen immediately.  Strict return precautions given.  Work excuse note provided.  Final Clinical Impressions(s) / UC Diagnoses   Final diagnoses:  Upper respiratory tract infection,  unspecified type  Acute cough     Discharge Instructions      I am glad that you are feeling better after your inhaler.  Use this every 4-6 hours as needed for coughing fits and shortness of breath.  Continue over-the-counter medications including Mucinex, Flonase, Tylenol.  Use Promethazine DM for cough.  This can make you sleepy so do not drive or drink alcohol while taking it.  If your symptoms are proving within a week please return for reevaluation.  If anything worsens and you have worsening cough, shortness of breath, fever, nausea, vomiting, weakness you need to be seen immediately.     ED Prescriptions     Medication Sig Dispense Auth. Provider   promethazine-dextromethorphan (PROMETHAZINE-DM) 6.25-15 MG/5ML syrup Take 5 mLs by mouth 3 (three) times daily as needed for cough. 118 mL Truly Stankiewicz K, PA-C      PDMP not reviewed this encounter.   Jeani Hawking, PA-C 07/20/22 1604

## 2022-07-20 NOTE — ED Notes (Signed)
Covid swab obtained, labled at bedside and then placed in lab

## 2022-07-20 NOTE — ED Triage Notes (Addendum)
Complains of cough, runny nose, sore throat and headache started on Friday 07/17/2022.    Has taken allergy medicine.

## 2022-07-20 NOTE — Discharge Instructions (Signed)
I am glad that you are feeling better after your inhaler.  Use this every 4-6 hours as needed for coughing fits and shortness of breath.  Continue over-the-counter medications including Mucinex, Flonase, Tylenol.  Use Promethazine DM for cough.  This can make you sleepy so do not drive or drink alcohol while taking it.  If your symptoms are proving within a week please return for reevaluation.  If anything worsens and you have worsening cough, shortness of breath, fever, nausea, vomiting, weakness you need to be seen immediately.

## 2022-07-21 LAB — SARS CORONAVIRUS 2 (TAT 6-24 HRS): SARS Coronavirus 2: NEGATIVE

## 2022-08-19 DIAGNOSIS — F3181 Bipolar II disorder: Secondary | ICD-10-CM | POA: Diagnosis not present

## 2022-08-26 DIAGNOSIS — F3181 Bipolar II disorder: Secondary | ICD-10-CM | POA: Diagnosis not present

## 2022-09-26 ENCOUNTER — Ambulatory Visit (HOSPITAL_COMMUNITY)
Admission: EM | Admit: 2022-09-26 | Discharge: 2022-09-26 | Disposition: A | Discharge status: Home / Self Care | Payer: Medicaid Other | Attending: Emergency Medicine | Admitting: Emergency Medicine

## 2022-09-26 ENCOUNTER — Encounter (HOSPITAL_COMMUNITY): Payer: Self-pay

## 2022-09-26 DIAGNOSIS — R0981 Nasal congestion: Secondary | ICD-10-CM | POA: Diagnosis not present

## 2022-09-26 DIAGNOSIS — H65191 Other acute nonsuppurative otitis media, right ear: Secondary | ICD-10-CM

## 2022-09-26 MED ORDER — AMOXICILLIN-POT CLAVULANATE 875-125 MG PO TABS: 1.0000 | ORAL_TABLET | Freq: Two times a day (BID) | ORAL | 0 refills | Status: DC

## 2022-09-26 MED ORDER — GUAIFENESIN ER 600 MG PO TB12: 1200.0000 mg | ORAL_TABLET | Freq: Two times a day (BID) | ORAL | 0 refills | Status: AC

## 2022-09-26 MED ORDER — IBUPROFEN 800 MG PO TABS
800.0000 mg | ORAL_TABLET | Freq: Once | ORAL | Status: AC
  Administered 2022-09-26: 800 mg via ORAL

## 2022-09-26 MED ORDER — BENZONATATE 100 MG PO CAPS: 100.0000 mg | ORAL_CAPSULE | Freq: Three times a day (TID) | ORAL | 0 refills | Status: DC

## 2022-09-26 MED ORDER — IBUPROFEN 800 MG PO TABS
ORAL_TABLET | ORAL | Status: AC
  Filled 2022-09-26: qty 1

## 2022-09-26 NOTE — ED Provider Notes (Signed)
[facility]    CSN: [id number] Arrival date & time: 09/26/22  1714      History   Chief Complaint Chief Complaint  Patient presents with   Cough   Ear Pain    HPI Sue Wiggins is a 22 y.o. female.   Patient presents to clinic for complaints of cough, right ear pain, sore throat and nasal congestion for the past 3 days.  She has also lost her voice.  She does not feel feverish, does have a temperature of 100 degrees in clinic and feels warm.  Cough is nonproductive, denies wheezing, chest pain or shortness of breath.  She has been taking DayQuil for her symptoms without much relief.  Was recently exposed to her nephew, who also has a cough.  She vapes daily.    The history is provided by the patient and medical records.  Cough Associated symptoms: ear pain, fever and sore throat   Associated symptoms: no chest pain, no shortness of breath and no wheezing     History reviewed. No pertinent past medical history.  Patient Active Problem List   Diagnosis Date Noted   Chlamydia infection 04/10/2021   Engages in vaping 04/08/2021    Past Surgical History:  Procedure Laterality Date   WISDOM TOOTH EXTRACTION  2016    OB History     Gravida  0   Para  0   Term  0   Preterm  0   AB  0   Living  0      SAB  0   IAB  0   Ectopic  0   Multiple  0   Live Births  0            Home Medications    Prior to Admission medications   Medication Sig Start Date End Date Taking? Authorizing Provider  amoxicillin-clavulanate (AUGMENTIN) 875-125 MG tablet Take 1 tablet by mouth every 12 (twelve) hours. 09/26/22  Yes Rinaldo Ratel, [place] N, FNP  benzonatate (TESSALON) 100 MG capsule Take 1 capsule (100 mg total) by mouth every 8 (eight) hours. 09/26/22  Yes Rinaldo Ratel, [place] N, FNP  guaiFENesin (MUCINEX) 600 MG 12 hr tablet Take 2 tablets (1,200 mg total) by mouth 2 (two) times daily for 7 days. 09/26/22 10/03/22 Yes Rinaldo Ratel, [place] N, FNP   hydrOXYzine (ATARAX) 10 MG tablet Take 10 mg by mouth 2 (two) times daily. 07/10/22   [provider]  lamoTRIgine (LAMICTAL) 25 MG tablet Take by mouth daily. Patient has not started this yet, but has picked it up today and plans to start taking    [provider]  norelgestromin-ethinyl estradiol [facility]) 150-35 MCG/24HR transdermal patch Place 1 patch onto the skin once a week. 02/18/22   Brock Bad, MD  promethazine-dextromethorphan (PROMETHAZINE-DM) 6.25-15 MG/5ML syrup Take 5 mLs by mouth 3 (three) times daily as needed for cough. 07/20/22   Raspet, Noberto Retort, PA-C  traZODone (DESYREL) 50 MG tablet Take 50 mg by mouth at bedtime as needed. 05/08/22   [provider]    Family History Family History  Problem Relation Age of Onset   Healthy Mother     Social History Social History   Tobacco Use   Smoking status: Never   Smokeless tobacco: Current   Tobacco comments:    Is vaping non tobacco products  Vaping Use   Vaping Use: Every day  Substance Use Topics   Alcohol use: Yes    Comment: occasionally   Drug  use: Yes    Types: Marijuana    Comment: occasionally     Allergies   Patient has no known allergies.   Review of Systems Review of Systems  Constitutional:  Positive for fever.  HENT:  Positive for congestion, ear pain, sore throat and voice change.   Respiratory:  Positive for cough. Negative for shortness of breath and wheezing.   Cardiovascular:  Negative for chest pain.     Physical Exam Triage Vital Signs ED Triage Vitals  Enc Vitals Group     BP 09/26/22 1729 126/76     Pulse Rate 09/26/22 1734 (!) 111     Resp 09/26/22 1729 18     Temp 09/26/22 1729 100 F (37.8 C)     Temp Source 09/26/22 1729 Oral     SpO2 09/26/22 1729 97 %     Weight --      Height --      Head Circumference --      Peak Flow --      Pain Score --      Pain Loc --      Pain Edu? --      Excl. in GC? --    No data found.  Updated Vital  Signs BP 126/76 (BP Location: Left Arm)   Pulse (!) 111   Temp 100 F (37.8 C) (Oral)   Resp 18   SpO2 97%   Visual Acuity Right Eye Distance:   Left Eye Distance:   Bilateral Distance:    Right Eye Near:   Left Eye Near:    Bilateral Near:     Physical Exam Vitals and nursing note reviewed.  Constitutional:      Appearance: Normal appearance.  HENT:     Head: Normocephalic and atraumatic.     Right Ear: Tympanic membrane is injected and erythematous.     Left Ear: Tympanic membrane, ear canal and external ear normal.     Ears:     Comments: Erythema and injection to the tympanic membrane with purulent fluid behind it.    Nose: Congestion present.     Mouth/Throat:     Mouth: Mucous membranes are moist.     Pharynx: Uvula midline. Posterior oropharyngeal erythema present.     Tonsils: No tonsillar exudate or tonsillar abscesses.  Eyes:     General: Lids are normal.     Conjunctiva/sclera: Conjunctivae normal.  Cardiovascular:     Rate and Rhythm: Normal rate and regular rhythm.     Heart sounds: Normal heart sounds. No murmur heard. Pulmonary:     Effort: Pulmonary effort is normal. No respiratory distress.     Breath sounds: Normal breath sounds.  Musculoskeletal:        General: Normal range of motion.  Skin:    General: Skin is warm and dry.  Neurological:     General: No focal deficit present.     Mental Status: She is alert.  Psychiatric:        Mood and Affect: Mood normal.        Behavior: Behavior is cooperative.      UC Treatments / Results  Labs (all labs ordered are listed, but only abnormal results are displayed) Labs Reviewed - No data to display  EKG   Radiology No results found.  Procedures Procedures (including critical care time)  Medications Ordered in UC Medications  ibuprofen (ADVIL) tablet 800 mg (has no administration in time range)    Initial Impression /  Assessment and Plan / UC Course  I have reviewed the triage vital  signs and the nursing notes.  Pertinent labs & imaging results that were available during my care of the patient were reviewed by me and considered in my medical decision making (see chart for details).  Vitals and triage reviewed, patient is hemodynamically stable.  Lungs vesicular posteriorly.  Right tympanic membrane is injected and erythematous with purulent fluid behind it.  Patient is borderline febrile in clinic and tachycardic.  Given ibuprofen.  Will treat with Augmentin for acute otitis media.  Symptomatic management for congestion and cough discussed.  Plan of care, follow-up care and return precautions given, no questions at this time.     Final Clinical Impressions(s) / UC Diagnoses   Final diagnoses:  Other non-recurrent acute nonsuppurative otitis media of right ear  Nasal sinus congestion     Discharge Instructions      You have a middle ear infection of your right ear.  Please take all antibiotics as prescribed and until finished, you take them with food to help prevent gastrointestinal upset.  He take the [facility] every 8 hours for cough, I suggest refraining from vaping while you are acutely ill.  You can take the Mucinex to loosen up your nasal secretions, I also suggest drinking at least 64 ounces of water daily, sleeping with a humidifier, doing warm saline gargles, and drinking tea with honey to help symptomatically with your nasal congestion.  For pain, fever and bodyaches, you can alternate between 800 mg of ibuprofen and 500 mg of Tylenol every 4-6 hours.  Please return to clinic or follow-up with your primary care provider if your symptoms persist despite completing antibiotics, or you develop any new or urgent symptoms.      ED Prescriptions     Medication Sig Dispense Auth. Provider   guaiFENesin (MUCINEX) 600 MG 12 hr tablet Take 2 tablets (1,200 mg total) by mouth 2 (two) times daily for 7 days. 28 tablet Rinaldo Ratel, [place] N, [place]    amoxicillin-clavulanate (AUGMENTIN) 875-125 MG tablet Take 1 tablet by mouth every 12 (twelve) hours. 14 tablet Rinaldo Ratel, [place] N, [place]   benzonatate (TESSALON) 100 MG capsule Take 1 capsule (100 mg total) by mouth every 8 (eight) hours. 21 capsule Izayiah Tibbitts, [place] N, [place]      PDMP not reviewed this encounter.   Rinaldo Ratel [place] N, [place] 09/26/22 [phone]

## 2022-09-26 NOTE — ED Triage Notes (Signed)
Pt present to the office for ear pain and cough x 3 days. Pt has been around her sick nephew. Pt reports body aches/

## 2022-09-26 NOTE — Discharge Instructions (Addendum)
You have a middle ear infection of your right ear.  Please take all antibiotics as prescribed and until finished, you take them with food to help prevent gastrointestinal upset.  He take the [facility] every 8 hours for cough, I suggest refraining from vaping while you are acutely ill.  You can take the Mucinex to loosen up your nasal secretions, I also suggest drinking at least 64 ounces of water daily, sleeping with a humidifier, doing warm saline gargles, and drinking tea with honey to help symptomatically with your nasal congestion.  For pain, fever and bodyaches, you can alternate between 800 mg of ibuprofen and 500 mg of Tylenol every 4-6 hours.  Please return to clinic or follow-up with your primary care provider if your symptoms persist despite completing antibiotics, or you develop any new or urgent symptoms.

## 2022-12-08 ENCOUNTER — Ambulatory Visit: Payer: Medicaid Other | Admitting: Obstetrics and Gynecology

## 2022-12-18 ENCOUNTER — Encounter (HOSPITAL_COMMUNITY): Payer: Self-pay

## 2022-12-18 ENCOUNTER — Ambulatory Visit (HOSPITAL_COMMUNITY)
Admission: EM | Admit: 2022-12-18 | Discharge: 2022-12-18 | Disposition: A | Discharge status: Home / Self Care | Payer: Medicaid Other | Attending: Internal Medicine | Admitting: Internal Medicine

## 2022-12-18 DIAGNOSIS — Z113 Encounter for screening for infections with a predominantly sexual mode of transmission: Secondary | ICD-10-CM | POA: Diagnosis not present

## 2022-12-18 LAB — HIV ANTIBODY (ROUTINE TESTING W REFLEX): HIV Screen 4th Generation wRfx: NONREACTIVE

## 2022-12-18 LAB — POCT URINE PREGNANCY: Preg Test, Ur: NEGATIVE

## 2022-12-18 NOTE — Discharge Instructions (Signed)
You will get your results back in a few days and someone will call if anything is positive and will prescribe medication if necessary. Return here as needed.

## 2022-12-18 NOTE — ED Provider Notes (Signed)
[facility]    CSN: [id number] Arrival date & time: 12/18/22  1337      History   Chief Complaint Chief Complaint  Patient presents with   SEXUALLY TRANSMITTED DISEASE    HPI Sue Wiggins is a 22 y.o. female.   Patient presents requesting STD screening and pregnancy test.  Denies any symptoms at this time or known exposure. Reports recent unprotected sexual activity with new partner.     History reviewed. No pertinent past medical history.  Patient Active Problem List   Diagnosis Date Noted   Chlamydia infection 04/10/2021   Engages in vaping 04/08/2021    Past Surgical History:  Procedure Laterality Date   WISDOM TOOTH EXTRACTION  2016    OB History     Gravida  0   Para  0   Term  0   Preterm  0   AB  0   Living  0      SAB  0   IAB  0   Ectopic  0   Multiple  0   Live Births  0            Home Medications    Prior to Admission medications   Medication Sig Start Date End Date Taking? Authorizing Provider  hydrOXYzine (ATARAX) 10 MG tablet Take 10 mg by mouth 2 (two) times daily. 07/10/22   [provider]  lamoTRIgine (LAMICTAL) 25 MG tablet Take by mouth daily. Patient has not started this yet, but has picked it up today and plans to start taking    [provider]  promethazine-dextromethorphan (PROMETHAZINE-DM) 6.25-15 MG/5ML syrup Take 5 mLs by mouth 3 (three) times daily as needed for cough. 07/20/22   Raspet, Noberto Retort, PA-C  traZODone (DESYREL) 50 MG tablet Take 50 mg by mouth at bedtime as needed. 05/08/22   [provider]    Family History Family History  Problem Relation Age of Onset   Healthy Mother     Social History Social History   Tobacco Use   Smoking status: Never   Smokeless tobacco: Current   Tobacco comments:    Is vaping non tobacco products  Vaping Use   Vaping status: Every Day  Substance Use Topics   Alcohol use: Yes    Comment: occasionally   Drug use: Yes     Types: Marijuana    Comment: occasionally     Allergies   Patient has no known allergies.   Review of Systems Review of Systems  Constitutional:  Negative for chills, fatigue and fever.  HENT:  Negative for sore throat.   Gastrointestinal:  Negative for abdominal pain, diarrhea, nausea and vomiting.  Genitourinary:  Negative for difficulty urinating, dysuria, flank pain, genital sores, hematuria, menstrual problem, pelvic pain, vaginal bleeding, vaginal discharge and vaginal pain.  Musculoskeletal:  Negative for back pain.  Skin:  Negative for color change.     Physical Exam Triage Vital Signs ED Triage Vitals  Encounter Vitals Group     BP 12/18/22 1506 123/73     Systolic BP Percentile --      Diastolic BP Percentile --      Pulse Rate 12/18/22 1506 63     Resp 12/18/22 1506 18     Temp 12/18/22 1506 98.9 F (37.2 C)     Temp Source 12/18/22 1506 Oral     SpO2 12/18/22 1506 98 %     Weight --      Height --  Head Circumference --      Peak Flow --      Pain Score 12/18/22 1507 0     Pain Loc --      Pain Education --      Exclude from Growth Chart --    No data found.  Updated Vital Signs BP 123/73 (BP Location: Left Arm)   Pulse 63   Temp 98.9 F (37.2 C) (Oral)   Resp 18   LMP 12/04/2022   SpO2 98%   Visual Acuity Right Eye Distance:   Left Eye Distance:   Bilateral Distance:    Right Eye Near:   Left Eye Near:    Bilateral Near:     Physical Exam Vitals and nursing note reviewed.  Constitutional:      General: She is awake. She is not in acute distress.    Appearance: Normal appearance. She is well-developed and well-groomed. She is not ill-appearing, toxic-appearing or diaphoretic.  Cardiovascular:     Rate and Rhythm: Normal rate.     Heart sounds: Normal heart sounds.  Pulmonary:     Effort: Pulmonary effort is normal. No respiratory distress.     Breath sounds: Normal breath sounds. No wheezing.  Genitourinary:    Comments: Exam  deferred.  Skin:    General: Skin is warm and dry.  Neurological:     Mental Status: She is alert.  Psychiatric:        Behavior: Behavior is cooperative.      UC Treatments / Results  Labs (all labs ordered are listed, but only abnormal results are displayed) Labs Reviewed  HIV ANTIBODY (ROUTINE TESTING W REFLEX)  RPR  POCT URINE PREGNANCY  CERVICOVAGINAL ANCILLARY ONLY    EKG   Radiology No results found.  Procedures Procedures (including critical care time)  Medications Ordered in UC Medications - No data to display  Initial Impression / Assessment and Plan / UC Course  I have reviewed the triage vital signs and the nursing notes.  Pertinent labs & imaging results that were available during my care of the patient were reviewed by me and considered in my medical decision making (see chart for details).     Patient presented for STD screening and pregnancy test.  Patient denies any symptoms at this time or known exposure.  Patient states she recently had unprotected sexual intercourse with new partner. Patient reports taking Plan B last night.  No significant findings upon exam. Informed patient she would receive results over the next few days. Return precautions discussed. Final Clinical Impressions(s) / UC Diagnoses   Final diagnoses:  Screening for STD (sexually transmitted disease)     Discharge Instructions      You will get your results back in a few days and someone will call if anything is positive and will prescribe medication if necessary. Return here as needed.     ED Prescriptions   None    PDMP not reviewed this encounter.   Wynonia Lawman A, NP 12/18/22 [phone]

## 2022-12-18 NOTE — ED Triage Notes (Signed)
Pt requesting STD testing with blood work. Denies sx's but states has a new partner.

## 2022-12-19 LAB — RPR: RPR Ser Ql: NONREACTIVE

## 2022-12-21 ENCOUNTER — Ambulatory Visit: Payer: Medicaid Other | Admitting: Medical

## 2022-12-21 LAB — CERVICOVAGINAL ANCILLARY ONLY
Bacterial Vaginitis (gardnerella): POSITIVE — AB
Candida Glabrata: NEGATIVE
Candida Vaginitis: POSITIVE — AB
Chlamydia: NEGATIVE
Comment: NEGATIVE
Comment: NEGATIVE
Comment: NEGATIVE
Comment: NEGATIVE
Comment: NEGATIVE
Comment: NORMAL
Neisseria Gonorrhea: NEGATIVE
Trichomonas: NEGATIVE

## 2022-12-22 ENCOUNTER — Telehealth: Payer: Self-pay | Admitting: Emergency Medicine

## 2022-12-22 MED ORDER — FLUCONAZOLE 150 MG PO TABS: 150.0000 mg | ORAL_TABLET | Freq: Once | ORAL | 0 refills | Status: AC

## 2022-12-22 MED ORDER — METRONIDAZOLE 500 MG PO TABS: 500.0000 mg | ORAL_TABLET | Freq: Two times a day (BID) | ORAL | 0 refills | Status: DC

## 2022-12-22 NOTE — Telephone Encounter (Signed)
Metronidazole for positive BV Diflucan for positive yeast

## 2022-12-28 ENCOUNTER — Encounter: Payer: Self-pay | Admitting: Advanced Practice Midwife

## 2022-12-28 ENCOUNTER — Ambulatory Visit: Payer: Medicaid Other | Admitting: Advanced Practice Midwife

## 2022-12-28 VITALS — BP 116/72 | HR 84 | Wt 134.2 lb

## 2022-12-28 DIAGNOSIS — Z3045 Encounter for surveillance of transdermal patch hormonal contraceptive device: Secondary | ICD-10-CM

## 2022-12-28 DIAGNOSIS — Z3009 Encounter for other general counseling and advice on contraception: Secondary | ICD-10-CM | POA: Diagnosis not present

## 2022-12-28 LAB — POCT URINE PREGNANCY: Preg Test, Ur: NEGATIVE

## 2022-12-28 MED ORDER — NORELGESTROMIN-ETH ESTRADIOL 150-35 MCG/24HR TD PTWK
1.0000 | MEDICATED_PATCH | TRANSDERMAL | 12 refills | Status: DC
Start: 2022-12-28 — End: 2023-09-01

## 2022-12-28 MED ORDER — MISOPROSTOL 200 MCG PO TABS
ORAL_TABLET | ORAL | 0 refills | Status: DC
Start: 2022-12-28 — End: 2023-09-01

## 2022-12-28 MED ORDER — CYCLOBENZAPRINE HCL 5 MG PO TABS
ORAL_TABLET | ORAL | 0 refills | Status: DC
Start: 2022-12-28 — End: 2023-09-01

## 2022-12-28 NOTE — Progress Notes (Signed)
   GYNECOLOGY PROGRESS NOTE  History:  22 y.o. G0P0000 presents to [facility] office today for a contraceptive visit. Pt is interested in restarting birth control patch, and may want an IUD in the near future.  Pt desires less menses, amenorrhea if possible, and pregnancy prevention.  She denies h/a, dizziness, shortness of breath, n/v, or fever/chills.    The following portions of the patient's history were reviewed and updated as appropriate: allergies, current medications, past family history, past medical history, past social history, past surgical history and problem list. Last pap smear on 05/13/21 was normal.  Health Maintenance Due  Topic Date Due   HPV VACCINES (1 - 3-dose series) Never done   DTaP/Tdap/Td (1 - Tdap) Never done   INFLUENZA VACCINE  Never done   COVID-19 Vaccine (1 - 2023-24 season) Never done     Review of Systems:  Pertinent items are noted in HPI.   Objective:  Physical Exam Blood pressure 116/72, pulse 84, weight 134 lb 3.2 oz (60.9 kg), last menstrual period 12/04/2022. VS reviewed, nursing note reviewed,  Constitutional: well developed, well nourished, no distress HEENT: normocephalic CV: normal rate Pulm/chest wall: normal effort Breast Exam: deferred Abdomen: soft Neuro: alert and oriented x 3 Skin: warm, dry Psych: affect normal Pelvic exam: Deferred  Assessment & Plan:   1. Encounter for counseling regarding contraception --Discussed pt contraceptive plans and reviewed contraceptive methods based on pt preferences and effectiveness.  Pt prefers patch today and will make appt for IUD at future visit. - POCT urine pregnancy --Pt vapes, no other risk factors for COCs, reviewed increased risks. Pt planning IUD in near future, which will reduce risk.  - norelgestromin-ethinyl estradiol [facility] [facility]ico) 150-35 MCG/24HR transdermal patch; Place 1 patch onto the skin once a week.  Dispense: 3 patch; Refill: 12 - cyclobenzaprine (FLEXERIL) 5 MG tablet; Take  10 mg (2 tablets) all at one time approximately 2 hours before your appointment time.  Dispense: 2 tablet; Refill: 0 - misoprostol (CYTOTEC) 200 MCG tablet; Take 600 mcg (3 tablets) all at one time approximately 2 hours before your appointment time.  Dispense: 3 tablet; Refill: 0    Return for Appt as soon as available for IUD insertion.   Sharen Counter, CNM 2:58 PM

## 2022-12-29 ENCOUNTER — Ambulatory Visit: Payer: Medicaid Other | Admitting: Obstetrics & Gynecology

## 2022-12-29 ENCOUNTER — Encounter: Payer: Self-pay | Admitting: Obstetrics & Gynecology

## 2022-12-29 VITALS — BP 118/81 | HR 67 | Ht 60.0 in | Wt 135.0 lb

## 2022-12-29 DIAGNOSIS — Z30430 Encounter for insertion of intrauterine contraceptive device: Secondary | ICD-10-CM

## 2022-12-29 NOTE — Progress Notes (Addendum)
22 y.o. 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   Last unprotected sex was yesterday Morning.  I recommend she abstain for 2 weeks and RTC for IUD insertion as she has had unprotected sex, Patient's last menstrual period was 12/04/2022.   Adam Phenix, MD

## 2023-01-11 ENCOUNTER — Ambulatory Visit (INDEPENDENT_AMBULATORY_CARE_PROVIDER_SITE_OTHER): Payer: Medicaid Other | Admitting: Obstetrics & Gynecology

## 2023-01-11 ENCOUNTER — Encounter: Payer: Self-pay | Admitting: Obstetrics & Gynecology

## 2023-01-11 VITALS — BP 115/79 | HR 64 | Ht 60.0 in | Wt 135.0 lb

## 2023-01-11 DIAGNOSIS — Z30430 Encounter for insertion of intrauterine contraceptive device: Secondary | ICD-10-CM

## 2023-01-11 DIAGNOSIS — Z30433 Encounter for removal and reinsertion of intrauterine contraceptive device: Secondary | ICD-10-CM | POA: Diagnosis not present

## 2023-01-11 DIAGNOSIS — T8332XA Displacement of intrauterine contraceptive device, initial encounter: Secondary | ICD-10-CM | POA: Diagnosis not present

## 2023-01-11 DIAGNOSIS — Z3202 Encounter for pregnancy test, result negative: Secondary | ICD-10-CM

## 2023-01-11 LAB — POCT URINE PREGNANCY: Preg Test, Ur: NEGATIVE

## 2023-01-11 MED ORDER — LEVONORGESTREL 20 MCG/DAY IU IUD
1.0000 | INTRAUTERINE_SYSTEM | Freq: Once | INTRAUTERINE | Status: AC
Start: 2023-01-11 — End: 2023-01-11
  Administered 2023-01-11: 1 via INTRAUTERINE

## 2023-01-11 NOTE — Progress Notes (Signed)
    GYNECOLOGY OFFICE PROCEDURE NOTE  Sue Wiggins is a 22 y.o. G0P0000 here for Mirena IUD insertion. No GYN concerns.  Last pap smear was on 05/13/2021 and was normal.  IUD Insertion Procedure Note Patient identified, informed consent performed, consent signed.   Discussed risks of irregular bleeding, cramping, infection, malpositioning or misplacement of the IUD outside the uterus which may require further procedure such as laparoscopy. Time out was performed.  Urine pregnancy test negative.  Speculum placed in the vagina.  Cervix visualized. Hurricaine spray applied. Cleaned with Betadine x 2.  Grasped anteriorly with a single tooth tenaculum.  Uterus sounded to 8 cm.   IUD placed per manufacturer's recommendations. IUD had to be replaced with a second device when the strings did not cut and device was displaced.  Strings trimmed to 3 cm. Tenaculum was removed, good hemostasis noted.  Patient tolerated procedure well.   Patient was given post-procedure instructions.  She was advised to have backup contraception for one week.  Patient was also asked to check IUD strings periodically and follow up in 4 weeks for IUD check.   Scheryl Darter, MD, FACOG Attending Obstetrician & Gynecologist, [facility] for [facility], [facility] GroupPatient ID: Calea Hribar, female   DOB: May 12, 2000, 22 y.o.   MRN: [id number]

## 2023-01-11 NOTE — Progress Notes (Addendum)
22 y.o. GYN presents for IUD (MIRENA) Insertion.  Last sexual intercourse was 2 weeks ago.  UPT today is Negative.  Administrations This Visit     levonorgestrel (MIRENA) 20 MCG/DAY IUD 1 each     Admin Date 01/11/2023 Action Given Dose 1 each Route Intrauterine Documented By Maretta Bees, RMA

## 2023-02-08 ENCOUNTER — Ambulatory Visit: Payer: Medicaid Other | Admitting: Advanced Practice Midwife

## 2023-07-07 ENCOUNTER — Telehealth (INDEPENDENT_AMBULATORY_CARE_PROVIDER_SITE_OTHER): Payer: Self-pay | Admitting: Primary Care

## 2023-07-07 NOTE — Telephone Encounter (Signed)
 Left VM with pt about their upcoming appt.

## 2023-07-08 ENCOUNTER — Ambulatory Visit (INDEPENDENT_AMBULATORY_CARE_PROVIDER_SITE_OTHER): Payer: Medicaid Other | Admitting: Primary Care

## 2023-09-01 ENCOUNTER — Ambulatory Visit: Admitting: Advanced Practice Midwife

## 2023-09-01 ENCOUNTER — Encounter: Payer: Self-pay | Admitting: Advanced Practice Midwife

## 2023-09-01 ENCOUNTER — Other Ambulatory Visit (HOSPITAL_COMMUNITY)
Admission: RE | Admit: 2023-09-01 | Discharge: 2023-09-01 | Disposition: A | Discharge status: Home / Self Care | Source: Ambulatory Visit | Attending: Advanced Practice Midwife | Admitting: Advanced Practice Midwife

## 2023-09-01 VITALS — BP 121/76 | HR 66 | Ht 60.0 in | Wt 138.0 lb

## 2023-09-01 DIAGNOSIS — Z975 Presence of (intrauterine) contraceptive device: Secondary | ICD-10-CM | POA: Diagnosis not present

## 2023-09-01 DIAGNOSIS — Z113 Encounter for screening for infections with a predominantly sexual mode of transmission: Secondary | ICD-10-CM | POA: Insufficient documentation

## 2023-09-01 DIAGNOSIS — Z3045 Encounter for surveillance of transdermal patch hormonal contraceptive device: Secondary | ICD-10-CM

## 2023-09-01 DIAGNOSIS — N921 Excessive and frequent menstruation with irregular cycle: Secondary | ICD-10-CM | POA: Diagnosis not present

## 2023-09-01 DIAGNOSIS — Z30431 Encounter for routine checking of intrauterine contraceptive device: Secondary | ICD-10-CM

## 2023-09-01 MED ORDER — NORELGESTROMIN-ETH ESTRADIOL 150-35 MCG/24HR TD PTWK: 1.0000 | MEDICATED_PATCH | TRANSDERMAL | 3 refills | Status: AC

## 2023-09-01 NOTE — Progress Notes (Signed)
 Pt presents for IUD check and STD testing. IUD placed 01/2023. Pt c/o periods every month with IUD.

## 2023-09-01 NOTE — Progress Notes (Signed)
   GYNECOLOGY CLINIC PROGRESS NOTE  History:  23 y.o. G0P0000 here at [facility] today for today for IUD string check; Mirena  IUD was placed  01/11/23. She reports regular menses, slightly lighter than before but longer, 7-8 days instead of 5. Otherwise there are no problems with the IUD.  She desires STI screening.  The following portions of the patient's history were reviewed and updated as appropriate: allergies, current medications, past family history, past medical history, past social history, past surgical history and problem list. Last pap smear on 05/13/21 was normal.  Review of Systems:  Pertinent items are noted in HPI.   Objective:  Physical Exam Blood pressure 121/76, pulse 66, height 5' (1.524 m), weight 138 lb (62.6 kg). Gen: NAD Abd: Soft, nontender and nondistended Pelvic: Normal appearing external genitalia; normal appearing vaginal mucosa and cervix.  IUD strings visualized, about 3 cm in length outside cervix.   Assessment & Plan:  1. Routine screening for STI (sexually transmitted infection) (Primary) --Pt had to leave office without blood draw since there was a wait. Pt to reschedule lab only visit.   - Cervicovaginal ancillary only( [place]) - HIV antibody (with reflex) - RPR - Hepatitis C Antibody - Hepatitis B Surface AntiGEN   2. IUD check up --IUD string wnl  3. Breakthrough bleeding associated with intrauterine device (IUD) --Pt bleeding is mostly regular, but more than expected with IUD.  Discussed how this may be  normal, and not all patients have amenorrhea with IUD.   --Pt like Xulane  patch she was using before IUD, so would like to use it to reduce bleeding and keep IUD now.   --Rx for Xulane  to take for 1-2 months, then stop and evaluate bleeding pattern/amount.  Pt prefers to keep IUD but was hoping for less bleeding.  Return in about 1 year (around 08/31/2024) for annual exam.   Arlester Bence, CNM 5:46 PM

## 2023-09-02 ENCOUNTER — Telehealth (INDEPENDENT_AMBULATORY_CARE_PROVIDER_SITE_OTHER): Payer: Self-pay | Admitting: Primary Care

## 2023-09-02 LAB — CERVICOVAGINAL ANCILLARY ONLY
Chlamydia: NEGATIVE
Comment: NEGATIVE
Comment: NEGATIVE
Comment: NORMAL
Neisseria Gonorrhea: NEGATIVE
Trichomonas: NEGATIVE

## 2023-09-02 NOTE — Telephone Encounter (Signed)
 Spoke to pt about upcoming appt.. Will be present

## 2023-09-03 ENCOUNTER — Telehealth (INDEPENDENT_AMBULATORY_CARE_PROVIDER_SITE_OTHER): Payer: Self-pay | Admitting: Primary Care

## 2023-09-03 ENCOUNTER — Ambulatory Visit (INDEPENDENT_AMBULATORY_CARE_PROVIDER_SITE_OTHER): Admitting: Primary Care

## 2023-09-03 NOTE — Telephone Encounter (Signed)
 Called pt to reschdule missed appt. Pt did not answer and LVM

## 2023-10-13 ENCOUNTER — Ambulatory Visit (INDEPENDENT_AMBULATORY_CARE_PROVIDER_SITE_OTHER): Admitting: Primary Care

## 2024-02-07 DIAGNOSIS — L299 Pruritus, unspecified: Secondary | ICD-10-CM | POA: Diagnosis not present

## 2024-03-09 DIAGNOSIS — N3000 Acute cystitis without hematuria: Secondary | ICD-10-CM | POA: Diagnosis not present

## 2024-03-09 DIAGNOSIS — Z113 Encounter for screening for infections with a predominantly sexual mode of transmission: Secondary | ICD-10-CM | POA: Diagnosis not present

## 2024-03-20 DIAGNOSIS — Z136 Encounter for screening for cardiovascular disorders: Secondary | ICD-10-CM | POA: Diagnosis not present
